# Patient Record
Sex: Female | Born: 1950 | Race: White | Hispanic: No | Marital: Single | Smoking: Never smoker
Health system: Southern US, Community
[De-identification: ages and names within clinical notes are randomized; demographics above are authoritative.]

## PROBLEM LIST (undated history)

## (undated) DIAGNOSIS — H269 Unspecified cataract: Secondary | ICD-10-CM

## (undated) DIAGNOSIS — J189 Pneumonia, unspecified organism: Secondary | ICD-10-CM

## (undated) DIAGNOSIS — E7800 Pure hypercholesterolemia, unspecified: Secondary | ICD-10-CM

## (undated) DIAGNOSIS — E559 Vitamin D deficiency, unspecified: Secondary | ICD-10-CM

## (undated) DIAGNOSIS — K219 Gastro-esophageal reflux disease without esophagitis: Secondary | ICD-10-CM

## (undated) DIAGNOSIS — C801 Malignant (primary) neoplasm, unspecified: Secondary | ICD-10-CM

## (undated) HISTORY — DX: Unspecified cataract: H26.9

## (undated) HISTORY — DX: Vitamin D deficiency, unspecified: E55.9

## (undated) HISTORY — PX: ABDOMINAL HYSTERECTOMY: SHX81

## (undated) HISTORY — DX: Gastro-esophageal reflux disease without esophagitis: K21.9

## (undated) HISTORY — PX: DILATION AND CURETTAGE OF UTERUS: SHX78

## (undated) HISTORY — DX: Malignant (primary) neoplasm, unspecified: C80.1

## (undated) HISTORY — DX: Pure hypercholesterolemia, unspecified: E78.00

---

## 1999-11-06 DIAGNOSIS — G35 Multiple sclerosis: Secondary | ICD-10-CM

## 1999-11-06 DIAGNOSIS — G709 Myoneural disorder, unspecified: Secondary | ICD-10-CM

## 1999-11-06 HISTORY — DX: Multiple sclerosis: G35

## 1999-11-06 HISTORY — DX: Myoneural disorder, unspecified: G70.9

## 2000-03-01 ENCOUNTER — Ambulatory Visit (HOSPITAL_COMMUNITY): Admission: RE | Admit: 2000-03-01 | Discharge: 2000-03-01 | Payer: Self-pay | Admitting: Psychiatry

## 2000-06-03 ENCOUNTER — Encounter: Admission: RE | Admit: 2000-06-03 | Discharge: 2000-06-03 | Payer: Self-pay | Admitting: Family Medicine

## 2000-06-03 ENCOUNTER — Encounter: Payer: Self-pay | Admitting: Family Medicine

## 2000-07-17 ENCOUNTER — Inpatient Hospital Stay (HOSPITAL_COMMUNITY): Admission: RE | Admit: 2000-07-17 | Discharge: 2000-07-19 | Payer: Self-pay | Admitting: Gynecology

## 2013-11-05 DIAGNOSIS — Z8719 Personal history of other diseases of the digestive system: Secondary | ICD-10-CM

## 2013-11-05 HISTORY — PX: BREAST SURGERY: SHX581

## 2013-11-05 HISTORY — DX: Personal history of other diseases of the digestive system: Z87.19

## 2014-02-03 HISTORY — PX: MASTECTOMY: SHX3

## 2014-07-15 ENCOUNTER — Ambulatory Visit: Payer: Self-pay | Admitting: Physical Therapy

## 2014-07-20 ENCOUNTER — Ambulatory Visit: Payer: Self-pay | Admitting: Physical Therapy

## 2014-07-28 ENCOUNTER — Ambulatory Visit: Payer: BC Managed Care – PPO | Attending: Oncology | Admitting: Physical Therapy

## 2014-07-28 DIAGNOSIS — M24519 Contracture, unspecified shoulder: Secondary | ICD-10-CM | POA: Insufficient documentation

## 2014-07-28 DIAGNOSIS — IMO0001 Reserved for inherently not codable concepts without codable children: Secondary | ICD-10-CM | POA: Diagnosis not present

## 2014-07-28 DIAGNOSIS — I890 Lymphedema, not elsewhere classified: Secondary | ICD-10-CM | POA: Diagnosis not present

## 2014-08-02 ENCOUNTER — Ambulatory Visit: Payer: BC Managed Care – PPO | Admitting: Physical Therapy

## 2014-08-02 DIAGNOSIS — IMO0001 Reserved for inherently not codable concepts without codable children: Secondary | ICD-10-CM | POA: Diagnosis not present

## 2014-08-04 ENCOUNTER — Ambulatory Visit: Payer: BC Managed Care – PPO | Admitting: Physical Therapy

## 2014-08-04 DIAGNOSIS — IMO0001 Reserved for inherently not codable concepts without codable children: Secondary | ICD-10-CM | POA: Diagnosis not present

## 2014-08-06 ENCOUNTER — Ambulatory Visit: Payer: BC Managed Care – PPO | Attending: Oncology | Admitting: Physical Therapy

## 2014-08-06 DIAGNOSIS — M24511 Contracture, right shoulder: Secondary | ICD-10-CM | POA: Diagnosis not present

## 2014-08-06 DIAGNOSIS — Z5189 Encounter for other specified aftercare: Secondary | ICD-10-CM | POA: Insufficient documentation

## 2014-08-06 DIAGNOSIS — I890 Lymphedema, not elsewhere classified: Secondary | ICD-10-CM | POA: Insufficient documentation

## 2014-08-09 ENCOUNTER — Ambulatory Visit: Payer: BC Managed Care – PPO | Admitting: Physical Therapy

## 2014-08-09 DIAGNOSIS — Z5189 Encounter for other specified aftercare: Secondary | ICD-10-CM | POA: Diagnosis not present

## 2014-08-11 ENCOUNTER — Ambulatory Visit: Payer: BC Managed Care – PPO | Admitting: Physical Therapy

## 2014-08-11 DIAGNOSIS — Z5189 Encounter for other specified aftercare: Secondary | ICD-10-CM | POA: Diagnosis not present

## 2014-08-13 ENCOUNTER — Ambulatory Visit: Payer: BC Managed Care – PPO | Admitting: Physical Therapy

## 2014-08-13 DIAGNOSIS — Z5189 Encounter for other specified aftercare: Secondary | ICD-10-CM | POA: Diagnosis not present

## 2014-08-16 ENCOUNTER — Ambulatory Visit: Payer: BC Managed Care – PPO | Admitting: Physical Therapy

## 2014-08-16 DIAGNOSIS — Z5189 Encounter for other specified aftercare: Secondary | ICD-10-CM | POA: Diagnosis not present

## 2014-08-18 ENCOUNTER — Ambulatory Visit: Payer: BC Managed Care – PPO | Admitting: Physical Therapy

## 2014-08-18 DIAGNOSIS — Z5189 Encounter for other specified aftercare: Secondary | ICD-10-CM | POA: Diagnosis not present

## 2014-08-20 ENCOUNTER — Ambulatory Visit: Payer: BC Managed Care – PPO | Admitting: Physical Therapy

## 2014-08-20 DIAGNOSIS — Z5189 Encounter for other specified aftercare: Secondary | ICD-10-CM | POA: Diagnosis not present

## 2014-08-23 ENCOUNTER — Ambulatory Visit: Payer: BC Managed Care – PPO | Admitting: Physical Therapy

## 2014-08-23 DIAGNOSIS — Z5189 Encounter for other specified aftercare: Secondary | ICD-10-CM | POA: Diagnosis not present

## 2014-08-25 ENCOUNTER — Ambulatory Visit: Payer: BC Managed Care – PPO | Admitting: Physical Therapy

## 2014-08-25 DIAGNOSIS — Z5189 Encounter for other specified aftercare: Secondary | ICD-10-CM | POA: Diagnosis not present

## 2014-08-27 ENCOUNTER — Ambulatory Visit: Payer: BC Managed Care – PPO | Admitting: Physical Therapy

## 2014-08-27 DIAGNOSIS — Z5189 Encounter for other specified aftercare: Secondary | ICD-10-CM | POA: Diagnosis not present

## 2014-08-30 ENCOUNTER — Ambulatory Visit: Payer: BC Managed Care – PPO | Admitting: Physical Therapy

## 2014-08-30 DIAGNOSIS — Z5189 Encounter for other specified aftercare: Secondary | ICD-10-CM | POA: Diagnosis not present

## 2014-09-01 ENCOUNTER — Ambulatory Visit: Payer: BC Managed Care – PPO

## 2014-09-01 ENCOUNTER — Encounter: Payer: BC Managed Care – PPO | Admitting: Physical Therapy

## 2014-09-01 DIAGNOSIS — Z5189 Encounter for other specified aftercare: Secondary | ICD-10-CM | POA: Diagnosis not present

## 2014-09-02 ENCOUNTER — Telehealth: Payer: Self-pay | Admitting: Physical Therapy

## 2014-09-03 ENCOUNTER — Encounter: Payer: BC Managed Care – PPO | Admitting: Physical Therapy

## 2014-09-03 ENCOUNTER — Ambulatory Visit: Payer: BC Managed Care – PPO | Admitting: Physical Therapy

## 2014-09-03 DIAGNOSIS — Z5189 Encounter for other specified aftercare: Secondary | ICD-10-CM | POA: Diagnosis not present

## 2014-09-06 ENCOUNTER — Encounter: Payer: BC Managed Care – PPO | Admitting: Physical Therapy

## 2014-09-07 ENCOUNTER — Ambulatory Visit: Payer: BC Managed Care – PPO | Attending: Oncology

## 2014-09-07 DIAGNOSIS — Z9013 Acquired absence of bilateral breasts and nipples: Secondary | ICD-10-CM

## 2014-09-07 DIAGNOSIS — M24511 Contracture, right shoulder: Secondary | ICD-10-CM | POA: Insufficient documentation

## 2014-09-07 DIAGNOSIS — Z5189 Encounter for other specified aftercare: Secondary | ICD-10-CM | POA: Diagnosis not present

## 2014-09-07 DIAGNOSIS — Z9889 Other specified postprocedural states: Secondary | ICD-10-CM | POA: Insufficient documentation

## 2014-09-07 DIAGNOSIS — I890 Lymphedema, not elsewhere classified: Secondary | ICD-10-CM | POA: Insufficient documentation

## 2014-09-07 DIAGNOSIS — C50911 Malignant neoplasm of unspecified site of right female breast: Secondary | ICD-10-CM

## 2014-09-07 DIAGNOSIS — L0390 Cellulitis, unspecified: Secondary | ICD-10-CM | POA: Insufficient documentation

## 2014-09-07 NOTE — Therapy (Signed)
Physical Therapy Treatment  Patient Details  Name: Meredith Fernandez MRN: [id number] Date of Birth: 1951/05/20  Encounter Date: 09/07/2014      PT End of Session - 09/07/14 1031    Visit Number 15   Number of Visits 24   PT Start Time 1027   PT Stop Time 2536   PT Time Calculation (min) 49 min      Past Medical History  Diagnosis Date  . Cancer     History reviewed. No pertinent past surgical history.  There were no vitals taken for this visit.  Visit Diagnosis:  Cancer of right breast  History of lymph node dissection of right axilla  H/O bilateral mastectomy      Subjective Assessment - 09/07/14 1029    Symptoms Took bandages off Sunday morning. Nurse gave patient cushioned adhesive to try at thumb web space.    Currently in Pain? No/denies       Manual lymph drainage in supine as follows: short neck, left axillary nodes, right inguinal nodes, superficial and deep abdominals; anterior inter-axillary anastamoses, right axillo-inguinal anastamoses. Right upper extremity from fingers and dorsal hand to lateral shoulder redirecting along pathways. Compression Bandaging to Rt UE: Biotone lotion,cushioned adhesive patient brought at thumb web space, thick stockinette, Elastomull to fingers 1-4 with 1/2" gray foam at dorsal hand and knuckle space, Artiflex x1 with Medi foam/small dots to forearm with 1/2" and 1/4" strips of gray foam over fibrotic tissue of forearm, and 1/4" gray foam to upper arm, 1-6 cm, 2-10 cm, and 1-12 cm short stretch compression bandages from Rt hand to almost axilla (stopped short due to patient receiving radiation).              Plan - 09/07/14 1031    Clinical Impression Statement Rt thumb space seems to be tolerating bandaging well, though does still present with some redness. Patient still presenting with Rt UE lymphedema.   PT Frequency Min 3X/week   PT Duration 8 weeks   PT Plan Continue complete decongestive therapy and remeasure  circumference and AROM next visit.        Problem List Patient Active Problem List   Diagnosis Date Noted  . Cancer of right breast 09/07/2014  . History of lymph node dissection of right axilla 09/07/2014  . Cellulitis left chest 09/07/2014  . H/O bilateral mastectomy 09/07/2014                                        Short Term Clinic Goals - 09/07/14 1034    Title Patient will be able to report overall pain decreased >/= 25% to tolerate daily tasks with less pain.   Time 4   Period Weeks   Status Achieved   Title Patient will be able to reduce edema by 1.5 cm at 10cm proximal to Rt ulnar styloid.   Time 4   Period Weeks   Status Achieved   Title Patient will be able to increase Rt shoulder shoulder active flexion range of motionto >/= 140 degrees for increased functional use of arm.    Time 4   Period Weeks   Status On-going   Title Patient will be able to increase Rt shoulder active abduction range of motion to >/= 120 degrees for increased functional use of arm.    Time 4   Period Weeks   Status Achieved  [facility] Goals - 09/07/14 1038    Title Patient will be able to verbalize good understanding of the Maintenance Phase of treatment including manual lymph drainage, use of compression, and lymphedema risk reduction practices.    Time 8   Period Weeks   Status On-going   Title Patient will be able to reduce edema by 2.5 cm at 10 cm proximal to Rt ulnar styloid.    Time 8   Period Weeks   Status On-going   Title Patient will be able to be independent with a home exercise program.   Time 8   Period Weeks   Status Achieved   Title Patient will be able to report overall pain decreased >/= 50% to tolerate daily tasks with less pain.    Time 8   Period Weeks   Status Achieved   Title Patient will be able to increase Rt shoulder active abduction range of motion to >/= 140 degrees tor increased functional use of arm.     Time 8   Period Weeks   Status On-going   Title Patient will be able to increase Rt shoulder active flexion range of motion to >/= 160 degrees for increased functional use of arm.   Time 8   Period Weeks   Status On-going   Additional Goals Yes         Collie Siad, PTA 09/07/2014 10:53 AM [name] 09/07/2014, 10:53 AM

## 2014-09-08 ENCOUNTER — Encounter: Payer: BC Managed Care – PPO | Admitting: Physical Therapy

## 2014-09-10 ENCOUNTER — Ambulatory Visit: Payer: BC Managed Care – PPO | Admitting: Physical Therapy

## 2014-09-10 DIAGNOSIS — Z9013 Acquired absence of bilateral breasts and nipples: Secondary | ICD-10-CM

## 2014-09-10 DIAGNOSIS — C50911 Malignant neoplasm of unspecified site of right female breast: Secondary | ICD-10-CM

## 2014-09-10 DIAGNOSIS — Z5189 Encounter for other specified aftercare: Secondary | ICD-10-CM | POA: Diagnosis not present

## 2014-09-10 DIAGNOSIS — Z9889 Other specified postprocedural states: Secondary | ICD-10-CM

## 2014-09-10 NOTE — Therapy (Signed)
Physical Therapy Treatment  Patient Details  Name: Meredith Fernandez MRN: [id number] Date of Birth: [birth year]  Encounter Date: 09/10/2014      PT End of Session - 09/10/14 1047    Visit Number 16   Number of Visits 24   PT Start Time 0800   PT Stop Time 0850   PT Time Calculation (min) 50 min      Past Medical History  Diagnosis Date  . Cancer     No past surgical history on file.  There were no vitals taken for this visit.  Visit Diagnosis:  Cancer of right breast  History of lymph node dissection of right axilla  H/O bilateral mastectomy          [facility] Adult PT Treatment/Exercise - 09/10/14 0918    Manual Therapy   Manual Therapy Edema management     Manual lymph drainage in supine as follows: short neck, left axillary nodes, right inguinal nodes, superficial and deep abdominals; anterior inter-axillary anastamoses, right axillo-inguinal anastamoses. Right upper extremity from fingers and dorsal hand to lateral shoulder redirecting along pathways. Patient did range of motion to shoulder, wrist, elbow, fingers and hand. Lotion applied, thick stockinette, foam to dorsal side of hand and at fingers, elastomull to all 5 fingers, orange comprex cut to fit anterior forearm with 1/2 foam to back of forearm, antecubital fossa, 1/4 inch foam at upper arm. 2 artiflex and 4 short stretch bandages       Plan - 09/10/14 1048    Clinical Impression Statement Patient did well with mepilex at thumb space and wants to continue with it. She also did well with extra foam at knuckles. She received a communication that her flexitouch will be delivered today and she should be able to try it out this weekend   PT Frequency Min 3X/week   PT Duration 8 weeks   PT Plan Continue complete decongestive therapy        Problem List Patient Active Problem List   Diagnosis Date Noted  . Cancer of right breast 09/07/2014  . History of lymph node dissection of right axilla 09/07/2014  .  Cellulitis left chest 09/07/2014  . H/O bilateral mastectomy 09/07/2014   Donato Heinz. Owens Shark, PT  09/10/2014, 10:55 AM

## 2014-09-13 ENCOUNTER — Encounter: Payer: BC Managed Care – PPO | Admitting: Physical Therapy

## 2014-09-14 ENCOUNTER — Ambulatory Visit: Payer: BC Managed Care – PPO | Admitting: Physical Therapy

## 2014-09-14 DIAGNOSIS — Z9889 Other specified postprocedural states: Secondary | ICD-10-CM

## 2014-09-14 DIAGNOSIS — C50911 Malignant neoplasm of unspecified site of right female breast: Secondary | ICD-10-CM

## 2014-09-14 DIAGNOSIS — Z9013 Acquired absence of bilateral breasts and nipples: Secondary | ICD-10-CM

## 2014-09-14 DIAGNOSIS — Z5189 Encounter for other specified aftercare: Secondary | ICD-10-CM | POA: Diagnosis not present

## 2014-09-14 NOTE — Therapy (Signed)
Physical Therapy Treatment  Patient Details  Name: Meredith Fernandez MRN: [id number] Date of Birth: 02/16/1951  Encounter Date: 09/14/2014      PT End of Session - 09/14/14 0934    Visit Number 17   Date for PT Re-Evaluation 09/27/14   PT Start Time 0805   PT Stop Time 0900   PT Time Calculation (min) 55 min      Past Medical History  Diagnosis Date  . Cancer     No past surgical history on file.  There were no vitals taken for this visit.  Visit Diagnosis:  Cancer of right breast  History of lymph node dissection of right axilla  H/O bilateral mastectomy      Subjective Assessment - 09/14/14 0924    Symptoms Pt reports she was unable to position for radiation treatment  with bandaging on her arm last week and it all had to be removed.  She will not be able to be bandaged while taking radiation and her last treatment is Dec. 16.  She has been trying to use her Flexitouch at home, but is having trouble applying it correctly.   Currently in Pain? No/denies            [facility] Adult PT Treatment/Exercise - 09/14/14 0927    Neck Exercises: Seated   Cervical Rotation Both;5 reps   Lateral Flexion Both;5 reps   Shoulder Rolls Backwards;5 reps;Forwards   avoid rolling on left due to portacath   Neck Exercises: Supine   Cervical Rotation 5 reps;Both   Lateral Flexion 5 reps;Both   Manual Therapy   Edema Management short neck, superficial and deep abdominals, left axilla , anterior interaxillary anastamoses, right inguinal nodes, right axillo-inguino anastamoses, right shoulder, upper arm, medial elbow, forearm , wirist, hand and fingers with return along lymphatic pathway   Other Manual Therapy neura glide and flossing to RUE          PT Education - 09/14/14 0933    Education provided Yes   Education Details cervical and scapular range of motion   Person(s) Educated Patient   Methods Explanation;Demonstration;Verbal cues   Comprehension Verbalized  understanding;Returned demonstration              Plan - 09/14/14 0935    Clinical Impression Statement lymphedema persists with firmness in anterior forearm, though tissue feels softer  after treatment. Pt was issued another Tg soft with attempt to bring it across upper back and tape it around tank top strap--- sister will help patient with this.  Emailed Rexford Maus and Flexiitouch to contact  patient to follow up   Pt will benefit from skilled therapeutic intervention in order to improve on the following deficits Increased edema;Decreased range of motion   PT Next Visit Plan manual lymph drainage, exercise (issue written home program)   PT Home Exercise Plan cervical range of motion, lymphatic remedial exercise        Problem List Patient Active Problem List   Diagnosis Date Noted  . Cancer of right breast 09/07/2014  . History of lymph node dissection of right axilla 09/07/2014  . Cellulitis left chest 09/07/2014  . H/O bilateral mastectomy 09/07/2014           Short Term Clinic Goals - 09/14/14 1808    CC Short Term Goal  #1   Title Patient will be able to report overall pain decreased >/= 25% to tolerate daily tasks with less pain.   Time 4   Period Weeks  Status Achieved   [facility] Short Term Goal  #2   Title Patient will be able to reduce edema by 1.5 cm at 10cm proximal to Rt ulnar styloid.   Time 4   Period Weeks   Status Achieved   [facility] Short Term Goal  #3   Period Weeks   Status On-going   CC Short Term Goal  #4   Title Patient will be able to increase Rt shoulder active abduction range of motion to >/= 120 degrees for increased functional use of arm.    Time 4   Period Weeks   Status Achieved          Long Term Clinic Goals - 09/14/14 1809    CC Long Term Goal  #1   Title Patient will be able to verbalize good understanding of the Maintenance Phase of treatment including manual lymph drainage, use of compression, and lymphedema risk reduction  practices.    Time 8   Period Weeks   Status On-going   CC Long Term Goal  #2   Title Patient will be able to reduce edema by 2.5 cm at 10 cm proximal to Rt ulnar styloid.    Time 8   Period Weeks   Status On-going   CC Long Term Goal  #3   Title Patient will be able to be independent with a home exercise program.   Time 8   Period Weeks   Status Achieved   CC Long Term Goal  #4   Title Patient will be able to report overall pain decreased >/= 50% to tolerate daily tasks with less pain.    Time 8   Period Weeks   Status Achieved   CC Long Term Goal  #5   Title Patient will be able to increase Rt shoulder active abduction range of motion to >/= 140 degrees tor increased functional use of arm.    Time 8   Period Weeks   Status On-going   CC Long Term Goal  #6   Title Patient will be able to increase Rt shoulder active flexion range of motion to >/= 160 degrees for increased functional use of arm.   Time 8   Period Weeks   Status On-going        Donato Heinz. Brown,PT  09/14/2014, 6:11 PM

## 2014-09-15 ENCOUNTER — Encounter: Payer: BC Managed Care – PPO | Admitting: Physical Therapy

## 2014-09-17 ENCOUNTER — Ambulatory Visit: Payer: BC Managed Care – PPO | Admitting: Physical Therapy

## 2014-09-17 DIAGNOSIS — Z9013 Acquired absence of bilateral breasts and nipples: Secondary | ICD-10-CM

## 2014-09-17 DIAGNOSIS — Z5189 Encounter for other specified aftercare: Secondary | ICD-10-CM | POA: Diagnosis not present

## 2014-09-17 DIAGNOSIS — Z9889 Other specified postprocedural states: Secondary | ICD-10-CM

## 2014-09-17 DIAGNOSIS — C50911 Malignant neoplasm of unspecified site of right female breast: Secondary | ICD-10-CM

## 2014-09-17 NOTE — Patient Instructions (Addendum)
Deep Breathing   Place hands on stomach and take a deep breath, filling diaphragm. Feel hands move out. Exhale fully and feel hands move in. Repeat deep breaths ____ times. Do ____ sessions per day.  http://gt2.exer.us/580   Copyright  VHI. All rights reserved.     DO  NOT DO NECK EXERCISES IF YOU FEEL PULLING AT YOUR PORT SITE  AROM: Neck Rotation   Turn head slowly to look over one shoulder, then the other. Hold each position ____ seconds. Repeat ____ times per set. Do ____ sets per session. Do ____ sessions per day.  http://orth.exer.us/295   Copyright  VHI. All rights reserved.  Neck Side-Bending   Begin with chin level, head centered over spine. Slowly lower one ear toward shoulder. Hold ____ seconds. Slowly return to starting position. Repeat to other side. Repeat ____ times to each side.  Copyright  VHI. All rights reserved.   atlernating backward shoulder rolls   "W" with elbows to back pocket  Arms crossed in front of you "X", open them up and raise  Up to a "Y"  BE GENTLE with you while having radiation! :-)

## 2014-09-17 NOTE — Therapy (Signed)
Physical Therapy Treatment  Patient Details  Name: Meredith Fernandez MRN: [id number] Date of Birth: [birth year]  Encounter Date: 09/17/2014      PT End of Session - 09/17/14 0902    Visit Number 18   Number of Visits 24   Date for PT Re-Evaluation 09/27/14   PT Start Time 0800   PT Stop Time 0848   PT Time Calculation (min) 48 min      Past Medical History  Diagnosis Date  . Cancer     No past surgical history on file.  There were no vitals taken for this visit.  Visit Diagnosis:  Cancer of right breast  History of lymph node dissection of right axilla  H/O bilateral mastectomy      Subjective Assessment - 09/17/14 0811    Symptoms had flexitouch set up and is now is able to use it on her own and plans to use it daily. garments are still in insurance preauthorization.  She feels well this morning   Currently in Pain? No/denies          [facility] PT Assessment - 09/17/14 0001    AROM   Right Shoulder Extension 68 Degrees   Right Shoulder Flexion 146 Degrees   Right Shoulder ABduction 148 Degrees   Right Shoulder Internal Rotation 62 Degrees   Right Shoulder External Rotation 76 Degrees          [facility] Adult PT Treatment/Exercise - 09/17/14 0816    Neck Exercises: Seated   Cervical Rotation Both;5 reps   Lateral Flexion Both;5 reps   Shoulder Rolls Backwards;5 reps;Forwards   avoid rolling on left due to portacath   Neck Exercises: Supine   Cervical Rotation --   Lateral Flexion --   Other Supine Exercise  continue with hand, wrist and hand exericses                Plan - 09/17/14 0902    Clinical Impression Statement Improvement on circumference measurements today and shoulder ROM is met. Pt to call MD about removal of portacath. She will continue with flexitouch, self manual lymph drainage, compression glove and tg soft at home until garments arrive. Doing well with addition of more exercise    PT Next Visit Plan Review and adjust exercise program,  continue with manual lymph drainage        Problem List Patient Active Problem List   Diagnosis Date Noted  . Cancer of right breast 09/07/2014  . History of lymph node dissection of right axilla 09/07/2014  . Cellulitis left chest 09/07/2014  . H/O bilateral mastectomy 09/07/2014            LYMPHEDEMA/ONCOLOGY QUESTIONNAIRE - 09/17/14 0826    Right Upper Extremity Lymphedema   10 cm Proximal to Olecranon Process 35.7 cm   Olecranon Process 28 cm   15 cm Proximal to Ulnar Styloid Process 29.8 cm   10 cm Proximal to Ulnar Styloid Process 28 cm   Just Proximal to Ulnar Styloid Process 19.4 cm   Across Hand at [employer] 21.6 cm   At [place] of 2nd Digit 7.3 cm   Other 29.5           Short Term Clinic Goals - 09/17/14 0905    CC Short Term Goal  #1   Title Patient will be able to report overall pain decreased >/= 25% to tolerate daily tasks with less pain.   Status New   CC Short Term Goal  #2  Title Patient will be able to reduce edema by 1.5 cm at 10cm proximal to Rt ulnar styloid.   Status Achieved   CC Short Term Goal  #3   Title Patient will be able to increase Rt shoulder shoulder active flexion range of motionto >/= 140 degrees for increased functional use of arm.    Status Achieved   CC Short Term Goal  #4   Title Patient will be able to increase Rt shoulder active abduction range of motion to >/= 120 degrees for increased functional use of arm.    Status Achieved          Long Term Clinic Goals - 09/17/14 0908    CC Long Term Goal  #1   Title Patient will be able to verbalize good understanding of the Maintenance Phase of treatment including manual lymph drainage, use of compression, and lymphedema risk reduction practices.    Status On-going   CC Long Term Goal  #2   Title Patient will be able to reduce edema by 2.5 cm at 10 cm proximal to Rt ulnar styloid.    Status On-going   CC Long Term Goal  #3   Title Patient will be able to be  independent with a home exercise program.   Status On-going   CC Long Term Goal  #4   Title Patient will be able to report overall pain decreased >/= 50% to tolerate daily tasks with less pain.    Status Achieved   CC Long Term Goal  #5   Title Patient will be able to increase Rt shoulder active abduction range of motion to >/= 140 degrees tor increased functional use of arm.    Status Achieved   CC Long Term Goal  #6   Title Patient will be able to increase Rt shoulder active flexion range of motion to >/= 160 degrees for increased functional use of arm.   Status On-going        Donato Heinz. Owens Shark, PT  09/17/2014, 9:10 AM

## 2014-09-20 ENCOUNTER — Ambulatory Visit: Payer: BC Managed Care – PPO | Admitting: Physical Therapy

## 2014-09-20 DIAGNOSIS — Z5189 Encounter for other specified aftercare: Secondary | ICD-10-CM | POA: Diagnosis not present

## 2014-09-20 DIAGNOSIS — Z9013 Acquired absence of bilateral breasts and nipples: Secondary | ICD-10-CM

## 2014-09-20 NOTE — Therapy (Signed)
Physical Therapy Treatment  Patient Details  Name: SADIYA DURAND MRN: [id number] Date of Birth: 01-25-1951  Encounter Date: 09/20/2014      PT End of Session - 09/20/14 1734    Visit Number 19   Number of Visits 24   Date for PT Re-Evaluation 09/27/14   PT Start Time 1520   PT Stop Time 1607   PT Time Calculation (min) 47 min      Past Medical History  Diagnosis Date  . Cancer     No past surgical history on file.  There were no vitals taken for this visit.  Visit Diagnosis:  H/O bilateral mastectomy      Subjective Assessment - 09/20/14 1525    Symptoms Worried that arms swells right back up after taking Reidsleeve off.  Has lost 12 lbs. since last Monday to today.   Currently in Pain? No/denies            [facility] Adult PT Treatment/Exercise - 09/20/14 0001    Exercises   Exercises Shoulder   Neck Exercises: Seated   Cervical Rotation Both;10 reps   Lateral Flexion Both;5 reps   Lateral Flexion Limitations --  left neck feels tight with left rotation and sidebend   Shoulder Exercises: Seated   Retraction AROM;Both;10 reps   Other Seated Exercises backward shoulder rolls x 10   Manual Therapy   Manual Therapy Edema management   Manual Therapy   Edema Management Manual lymph drainage:  short neck, superficial and deep abdomen, left axilla and anterior interaxillary anastomosis, right groin and axillo-inguinal anastomosis, and right UE from fingers to shoulder.  Gave patient new TG soft to wear.                Plan - 09/20/14 1735    Clinical Impression Statement Circumference measurements were increased today at her wrist; more proximal measurements were a bit smaller.   Pt will benefit from skilled therapeutic intervention in order to improve on the following deficits Increased edema;Decreased strength   Rehab Potential Excellent   PT Next Visit Plan G-code next.  Continue exercise and manual lymph drainage.        Problem List Patient  Active Problem List   Diagnosis Date Noted  . Cancer of right breast 09/07/2014  . History of lymph node dissection of right axilla 09/07/2014  . Cellulitis left chest 09/07/2014  . H/O bilateral mastectomy 09/07/2014            LYMPHEDEMA/ONCOLOGY QUESTIONNAIRE - 09/20/14 1528    Right Upper Extremity Lymphedema   10 cm Proximal to Olecranon Process 34.4 cm   Olecranon Process 28 cm   15 cm Proximal to Ulnar Styloid Process 29.1 cm   10 cm Proximal to Ulnar Styloid Process 26.5 cm   Just Proximal to Ulnar Styloid Process 21.2 cm   Across Hand at [employer] 21.7 cm   At [place] of 2nd Digit 7.2 cm                                        Long Term Clinic Goals - 09/20/14 1737    CC Long Term Goal  #1   Status Achieved          [place],DONNA, PT 09/20/2014, 5:38 PM

## 2014-09-22 ENCOUNTER — Ambulatory Visit: Payer: BC Managed Care – PPO | Admitting: Physical Therapy

## 2014-09-22 ENCOUNTER — Encounter: Payer: BC Managed Care – PPO | Admitting: Physical Therapy

## 2014-09-22 DIAGNOSIS — Z5189 Encounter for other specified aftercare: Secondary | ICD-10-CM | POA: Diagnosis not present

## 2014-09-22 DIAGNOSIS — C50911 Malignant neoplasm of unspecified site of right female breast: Secondary | ICD-10-CM

## 2014-09-22 DIAGNOSIS — Z9013 Acquired absence of bilateral breasts and nipples: Secondary | ICD-10-CM

## 2014-09-22 DIAGNOSIS — Z9889 Other specified postprocedural states: Secondary | ICD-10-CM

## 2014-09-22 NOTE — Therapy (Signed)
Physical Therapy Treatment  Patient Details  Name: Meredith Fernandez MRN: [id number] Date of Birth: [birth year]  Encounter Date: 09/22/2014      PT End of Session - 09/22/14 0957    Visit Number 20   Number of Visits 24   Date for PT Re-Evaluation 09/27/14   PT Start Time 0850   PT Stop Time 0935   PT Time Calculation (min) 45 min      Past Medical History  Diagnosis Date  . Cancer     No past surgical history on file.  There were no vitals taken for this visit.  Visit Diagnosis:  H/O bilateral mastectomy  Cancer of right breast  History of lymph node dissection of right axilla      Subjective Assessment - 09/22/14 0951    Symptoms feeling tired today.  has developed an itchy red rash on chest that she is treating with over the counter hydrocortisone   Currently in Pain? No/denies            [facility] Adult PT Treatment/Exercise - 09/22/14 0954    Exercises   Exercises Shoulder   Neck Exercises: Seated   Cervical Rotation Both;5 reps   Lateral Flexion Both;5 reps   Lateral Flexion Limitations --  left neck feels tight with left rotation and sidebend   Shoulder Rolls Backwards;5 reps   Shoulder Exercises: Seated   Retraction AROM;Both;10 reps   Other Seated Exercises backward shoulder rolls x 10   Other Seated Exercises diphagmatic breathing   Manual Therapy   Manual Therapy Edema management       Manual lymph drainage in supine as follows: short neck, right inguinal nodes, superficial and deep abdominals, right axillo-inguinal anastamoses. Right upper extremity from fingers and dorsal hand to lateral shoulder redirecting along pathways. Avoided skin that has is red from radiation.       Plan - 09/22/14 0957    Clinical Impression Statement Arm feels soft overall today, but still with area of some firmer tissue at anterior forearm.    PT Next Visit Plan Continue exercise and manual lymph drainage.        Problem List Patient Active Problem List   Diagnosis Date Noted  . Cancer of right breast 09/07/2014  . History of lymph node dissection of right axilla 09/07/2014  . Cellulitis left chest 09/07/2014  . H/O bilateral mastectomy 09/07/2014     Donato Heinz. Brown,PT  09/22/2014, 10:01 AM

## 2014-09-24 ENCOUNTER — Ambulatory Visit: Payer: BC Managed Care – PPO | Admitting: Physical Therapy

## 2014-09-24 DIAGNOSIS — Z5189 Encounter for other specified aftercare: Secondary | ICD-10-CM | POA: Diagnosis not present

## 2014-09-24 DIAGNOSIS — Z9013 Acquired absence of bilateral breasts and nipples: Secondary | ICD-10-CM

## 2014-09-24 DIAGNOSIS — Z9889 Other specified postprocedural states: Secondary | ICD-10-CM

## 2014-09-24 DIAGNOSIS — C50911 Malignant neoplasm of unspecified site of right female breast: Secondary | ICD-10-CM

## 2014-09-24 NOTE — Therapy (Signed)
Physical Therapy Treatment  Patient Details  Name: Meredith Fernandez MRN: [id number] Date of Birth: August 21, 1951  Encounter Date: 09/24/2014      PT End of Session - 09/24/14 0954    Visit Number 21   Number of Visits 24   Date for PT Re-Evaluation 09/27/14   PT Start Time 0846   PT Stop Time 0940   PT Time Calculation (min) 54 min   Activity Tolerance Patient tolerated treatment well      Past Medical History  Diagnosis Date  . Cancer     No past surgical history on file.  There were no vitals taken for this visit.  Visit Diagnosis:  H/O bilateral mastectomy  Cancer of right breast  History of lymph node dissection of right axilla      Subjective Assessment - 09/24/14 0902    Symptoms feeling ok today, better energy   Currently in Pain? No/denies          [facility] PT Assessment - 09/24/14 0949    AROM   Right Shoulder Flexion 147 Degrees   Right Shoulder ABduction 148 Degrees          [facility] Adult PT Treatment/Exercise - 09/24/14 0001    Neck Exercises: Stretches   Lower Cervical/Upper Thoracic Stretch 3 reps   Neck Exercises: Seated   Cervical Rotation Both;5 reps   Lateral Flexion Both;5 reps   Lateral Flexion Limitations --  left neck feels tight with left rotation and sidebend   Shoulder Rolls Backwards;5 reps   Shoulder Exercises: Seated   Retraction AROM;Both;10 reps   Other Seated Exercises diphagmatic breathing     Manual lymph drainage in supine as follows: short neck, right inguinal nodes, superficial and deep abdominals; right axillo-inguinal anastamoses. Right upper extremity from fingers and dorsal hand to lateral shoulder redirecting along pathways. Avoided anterior chest and right axilla due to sking irritation from radiation.  Extra time spent on right anterior forearm and range of motion at wrist and forearm done concurrently at time to enhance manual lymph drainage        PT Education - 09/24/14 0953    Education provided Yes   Education Details lymphatc remedial exercise   Person(s) Educated Patient   Methods Explanation;Demonstration;Verbal cues;Handout   Comprehension Verbalized understanding;Returned demonstration              Plan - 09/24/14 0955    Clinical Impression Statement Arm continues to feel softer at anterior forearm.  She has increase redness at axilla. Pt encouraged to practice self manual lymph drainage with DVD this weekend and add more range of motion exercise throughout the day   PT Next Visit Plan Continue exercise and manual lymph drainage.        Problem List Patient Active Problem List   Diagnosis Date Noted  . Cancer of right breast 09/07/2014  . History of lymph node dissection of right axilla 09/07/2014  . Cellulitis left chest 09/07/2014  . H/O bilateral mastectomy 09/07/2014        Short Term Clinic Goals - 09/24/14 [phone]    CC Short Term Goal  #1   Title Patient will be able to report overall pain decreased >/= 25% to tolerate daily tasks with less pain.   Time 4   Period Weeks   Status Achieved   CC Short Term Goal  #2   Title Patient will be able to reduce edema by 1.5 cm at 10cm proximal to Rt ulnar styloid.   Time  4   Status Achieved   CC Short Term Goal  #3   Title Patient will be able to increase Rt shoulder shoulder active flexion range of motionto >/= 140 degrees for increased functional use of arm.    Time 4   Period Weeks   Status Achieved   CC Short Term Goal  #4   Time 4   Period Weeks   Status Achieved          Long Term Clinic Goals - 09/24/14 3762    CC Long Term Goal  #1   Title Patient will be able to verbalize good understanding of the Maintenance Phase of treatment including manual lymph drainage, use of compression, and lymphedema risk reduction practices.    Period Weeks   Status Achieved   CC Long Term Goal  #2   Title Patient will be able to reduce edema by 2.5 cm at 10 cm proximal to Rt ulnar styloid.    Time 8   Period  Weeks   Status On-going   CC Long Term Goal  #3   Title Patient will be able to be independent with a home exercise program.   Time 8   Period Weeks   Status On-going   CC Long Term Goal  #4   Title Patient will be able to report overall pain decreased >/= 50% to tolerate daily tasks with less pain.    Time 8   Period Weeks   Status Achieved   CC Long Term Goal  #5   Title Patient will be able to increase Rt shoulder active abduction range of motion to >/= 140 degrees tor increased functional use of arm.    Time 8   Period Weeks   Status New   CC Long Term Goal  #6   Title Patient will be able to increase Rt shoulder active flexion range of motion to >/= 160 degrees for increased functional use of arm.   Time 8   Period Weeks   Status On-going  axilla currently red an irritated from [place]. Brown, PT 09/24/2014, 10:00 AM

## 2014-09-24 NOTE — Patient Instructions (Signed)
Diaphragmatic - Sitting or Standing   Inhale through nose making navel move out toward hands. Exhale through puckered lips, hands follow navel in. Repeat __3_ times. Rest __10_ seconds between repeats. Do _3__ times per day.  Copyright  VHI. All rights reserved.  Elevation / Depression   While slowly inhaling, bring shoulders toward ears. Hold ___ seconds. Slowly exhale while relaxing shoulders backward and downward. Repeat _5__ times. Do _5__ times per day.  Copyright  VHI. All rights reserved.  Chicken Wings   With thumbs in armpits and elbows at sides, lift elbows toward ears then lower to sides. Repeat _5_ times. Do _2_ times per day.  Copyright  VHI. All rights reserved.  Arm Cross Above Head   With arms straight out from sides of trunk, cross over above head. Repeat _5__ times. Do __2_ times per day.  Copyright  VHI. All rights reserved.  Arm Cross Above Head   With arms straight out from sides of trunk, cross over above head. Repeat ___ times. Do ___ times per day.  Copyright  VHI. All rights reserved.

## 2014-09-27 ENCOUNTER — Ambulatory Visit: Payer: BC Managed Care – PPO | Admitting: Physical Therapy

## 2014-09-27 DIAGNOSIS — Z9889 Other specified postprocedural states: Secondary | ICD-10-CM

## 2014-09-27 DIAGNOSIS — C50911 Malignant neoplasm of unspecified site of right female breast: Secondary | ICD-10-CM

## 2014-09-27 DIAGNOSIS — Z9013 Acquired absence of bilateral breasts and nipples: Secondary | ICD-10-CM

## 2014-09-27 DIAGNOSIS — Z5189 Encounter for other specified aftercare: Secondary | ICD-10-CM | POA: Diagnosis not present

## 2014-09-27 NOTE — Therapy (Signed)
Physical Therapy Treatment  Patient Details  Name: JAVAE BRAATEN MRN: [id number] Date of Birth: 1951-07-27  Encounter Date: 09/27/2014      PT End of Session - 09/27/14 1214    Visit Number 32  next wll be 1   Number of Visits 24  next will be 8   Date for PT Re-Evaluation 09/27/14  next will be 10/25/2014   PT Start Time 0802   PT Stop Time 0848   PT Time Calculation (min) 46 min   Behavior During Therapy [facility] for tasks assessed/performed      Past Medical History  Diagnosis Date  . Cancer     No past surgical history on file.  There were no vitals taken for this visit.  Visit Diagnosis:  H/O bilateral mastectomy  Cancer of right breast  History of lymph node dissection of right axilla      Subjective Assessment - 09/27/14 0808    Symptoms " I feel good" some itching at chest, but otherwise OK    Currently in Pain? No/denies            [facility] Adult PT Treatment/Exercise - 09/27/14 1212    Neck Exercises: Stretches   Lower Cervical/Upper Thoracic Stretch 3 reps   Neck Exercises: Seated   Cervical Rotation Both;5 reps   Lateral Flexion Both;5 reps   Lateral Flexion Limitations --  left neck feels tight with left rotation and sidebend   W Back 5 reps   Shoulder Rolls Backwards;5 reps   Shoulder Exercises: Seated   Retraction AROM;Both;10 reps   Other Seated Exercises diphagmatic breathing       Manual lymph drainage in supine as follows: short neck, left axillary nodes, right inguinal nodes, superficial and deep abdominals; anterior inter-axillary anastamoses, right axillo-inguinal anastamoses. Right upper extremity from fingers and dorsal hand to lateral shoulder redirecting along pathways.         Plan - 09/27/14 1216    Clinical Impression Statement Remeasure arm and she has reduction in hand and wrist are.  Pt reports her scales at home show a 14 pound weight loss since last week.   She will check that out at MD office.  She feels well and  feels she has more energy. Recertification sent for more visits so she can keep coming until garments arrive. She is able to do her exercise with less verbal cues   PT Next Visit Plan Continue exercise and manual lymph drainage.        Problem List Patient Active Problem List   Diagnosis Date Noted  . Cancer of right breast 09/07/2014  . History of lymph node dissection of right axilla 09/07/2014  . Cellulitis left chest 09/07/2014  . H/O bilateral mastectomy 09/07/2014            LYMPHEDEMA/ONCOLOGY QUESTIONNAIRE - 09/27/14 0812    Right Upper Extremity Lymphedema   Olecranon Process 28 cm   15 cm Proximal to Ulnar Styloid Process 29.4 cm   10 cm Proximal to Ulnar Styloid Process 26.5 cm   Just Proximal to Ulnar Styloid Process 19.2 cm   Across Hand at [employer] 21.5 cm   At [place] of 2nd Digit 7 cm           Short Term Clinic Goals - 09/27/14 1219    CC Short Term Goal  #1   Title Patient will be able to report overall pain decreased >/= 25% to tolerate daily tasks with less pain.  Status Achieved   CC Short Term Goal  #2   Title Patient will be able to reduce edema by 1.5 cm at 10cm proximal to Rt ulnar styloid.   Status Achieved   CC Short Term Goal  #3   Title Patient will be able to increase Rt shoulder shoulder active flexion range of motionto >/= 140 degrees for increased functional use of arm.    Status Achieved          Long Term Clinic Goals - 09/27/14 1219    CC Long Term Goal  #1   Title Patient will be able to verbalize good understanding of the Maintenance Phase of treatment including manual lymph drainage, use of compression, and lymphedema risk reduction practices.    Status Achieved   CC Long Term Goal  #2   Title Patient will be able to reduce edema by 2.5 cm at 10 cm proximal to Rt ulnar styloid.    Time 4   Period Weeks   Status On-going   CC Long Term Goal  #3   Title Patient will be able to be independent with a home exercise  program.   Time 4   Period Weeks   Status On-going   CC Long Term Goal  #4   Title Patient will be able to report overall pain decreased >/= 50% to tolerate daily tasks with less pain.    Status Achieved   CC Long Term Goal  #5   Title Patient will be able to increase Rt shoulder active abduction range of motion to >/= 140 degrees tor increased functional use of arm.    Time 4   Period Weeks   Status On-going   CC Long Term Goal  #6   Title Patient will be able to increase Rt shoulder active flexion range of motion to >/= 160 degrees for increased functional use of arm.   Time 4   Period Weeks   Status On-going         Donato Heinz. Owens Shark, PT [place] 09/27/2014, 12:21 PM

## 2014-09-29 ENCOUNTER — Ambulatory Visit: Payer: BC Managed Care – PPO | Admitting: Physical Therapy

## 2014-09-29 DIAGNOSIS — Z5189 Encounter for other specified aftercare: Secondary | ICD-10-CM | POA: Diagnosis not present

## 2014-09-29 DIAGNOSIS — C50911 Malignant neoplasm of unspecified site of right female breast: Secondary | ICD-10-CM

## 2014-09-29 DIAGNOSIS — Z9013 Acquired absence of bilateral breasts and nipples: Secondary | ICD-10-CM

## 2014-09-29 DIAGNOSIS — Z9889 Other specified postprocedural states: Secondary | ICD-10-CM

## 2014-09-29 NOTE — Therapy (Signed)
Physical Therapy Treatment  Patient Details  Name: Meredith Fernandez MRN: [id number] Date of Birth: [birth year]  Encounter Date: 09/29/2014      PT End of Session - 09/29/14 1242    Visit Number 23   Number of Visits 32   Date for PT Re-Evaluation 10/25/14   PT Start Time 4585   PT Stop Time 1100   PT Time Calculation (min) 45 min      Past Medical History  Diagnosis Date  . Cancer     No past surgical history on file.  There were no vitals taken for this visit.  Visit Diagnosis:  H/O bilateral mastectomy  Cancer of right breast  History of lymph node dissection of right axilla      Subjective Assessment - 09/29/14 1020    Symptoms pt reports she is a little tired and has been at work already this morning      Treatment: Manual lymph drainage in supine as follows: short neck, , right inguinal nodes, superficial and deep abdominals; , right axillo-inguinal anastamoses. Right upper extremity from fingers and dorsal hand to lateral shoulder redirecting along pathways. Avoided all areas affected by radiation      Plan - 09/29/14 1251    Clinical Impression Statement --  pt doing her neck and scapular range of motion on her own        Problem List Patient Active Problem List   Diagnosis Date Noted  . Cancer of right breast 09/07/2014  . History of lymph node dissection of right axilla 09/07/2014  . Cellulitis left chest 09/07/2014  . H/O bilateral mastectomy 09/07/2014       Donato Heinz. Owens Shark, PT 09/29/2014, 12:53 PM

## 2014-10-07 ENCOUNTER — Ambulatory Visit: Payer: BC Managed Care – PPO | Attending: Oncology | Admitting: Physical Therapy

## 2014-10-07 DIAGNOSIS — Z5189 Encounter for other specified aftercare: Secondary | ICD-10-CM | POA: Insufficient documentation

## 2014-10-07 DIAGNOSIS — I890 Lymphedema, not elsewhere classified: Secondary | ICD-10-CM | POA: Diagnosis not present

## 2014-10-07 DIAGNOSIS — M24511 Contracture, right shoulder: Secondary | ICD-10-CM | POA: Insufficient documentation

## 2014-10-07 DIAGNOSIS — Z9889 Other specified postprocedural states: Secondary | ICD-10-CM

## 2014-10-07 DIAGNOSIS — C50911 Malignant neoplasm of unspecified site of right female breast: Secondary | ICD-10-CM

## 2014-10-07 DIAGNOSIS — Z9013 Acquired absence of bilateral breasts and nipples: Secondary | ICD-10-CM

## 2014-10-07 NOTE — Therapy (Signed)
[place], [place], [address] Phone: [phone]   Fax:  [phone]  Physical Therapy Treatment  Patient Details  Name: Meredith Fernandez MRN: [id number] Date of Birth: 11-02-1951  Encounter Date: 10/07/2014      PT End of Session - 10/07/14 0958    Visit Number 24   Number of Visits 32   Date for PT Re-Evaluation 10/25/14   PT Start Time 0845   PT Stop Time 0930   PT Time Calculation (min) 45 min   Activity Tolerance Patient tolerated treatment well   Behavior During Therapy [facility] for tasks assessed/performed      Past Medical History  Diagnosis Date  . Cancer     No past surgical history on file.  There were no vitals taken for this visit.  Visit Diagnosis:  H/O bilateral mastectomy  Cancer of right breast  History of lymph node dissection of right axilla      Subjective Assessment - 10/07/14 0952    Symptoms Pt states she had a rough weekend.  She has had skin breakdown at right chest in raditaion field that has been draining on her clothing. She reports that she has lost 15 pounds in the past 3 weeks unintentionally   Currently in Pain? No/denies  she does heve discomfort when radiation field is stretched      Treatment Manual lymph drainage in supine as follows: short neck, left axillary nodes, right inguinal nodes, superficial and deep abdominals; anterior inter-axillary anastamoses, right axillo-inguinal anastamoses. Right upper extremity from fingers and dorsal hand to lateral shoulder redirecting along pathways. Avoided radiation fields.  Pt rolled to left sidelying and worked on lateral chest and back to congested areas         Plan - 10/07/14 0958    Clinical Impression Statement Edema continues on eight arm, but does not feel as firm as it had been.  She continues to have most congestion in hand and palmar side of forearm.     PT Next Visit Plan Continue exercise and manual lymph  drainage.until garments arrive.         Long Term Clinic Goals - 10/07/14 1000    CC Long Term Goal  #1   Title Patient will be able to verbalize good understanding of the Maintenance Phase of treatment including manual lymph drainage, use of compression, and lymphedema risk reduction practices.    Time 8   Period Weeks   Status Achieved   CC Long Term Goal  #2   Title Patient will be able to reduce edema by 2.5 cm at 10 cm proximal to Rt ulnar styloid.    Time 4   Period Weeks   Status On-going   CC Long Term Goal  #3   Title Patient will be able to be independent with a home exercise program.   Time 4   Period Weeks   Status On-going   CC Long Term Goal  #4   Title Patient will be able to report overall pain decreased >/= 50% to tolerate daily tasks with less pain.    Time 8   Status Achieved   CC Long Term Goal  #5   Title Patient will be able to increase Rt shoulder active abduction range of motion to >/= 140 degrees tor increased functional use of arm.    Time 4   Period Weeks   Status On-going   CC Long Term Goal  #6   Title Patient  will be able to increase Rt shoulder active flexion range of motion to >/= 160 degrees for increased functional use of arm.   Time 4   Period Weeks   Status On-going         Problem List Patient Active Problem List   Diagnosis Date Noted  . Cancer of right breast 09/07/2014  . History of lymph node dissection of right axilla 09/07/2014  . Cellulitis left chest 09/07/2014  . H/O bilateral mastectomy 09/07/2014   Donato Heinz. Owens Shark, PT   10/07/2014, 10:02 AM

## 2014-10-11 ENCOUNTER — Ambulatory Visit: Payer: BC Managed Care – PPO | Admitting: Physical Therapy

## 2014-10-14 ENCOUNTER — Encounter: Payer: Self-pay | Admitting: Physical Therapy

## 2014-10-14 ENCOUNTER — Ambulatory Visit: Payer: BC Managed Care – PPO | Admitting: Physical Therapy

## 2014-10-14 DIAGNOSIS — Z9889 Other specified postprocedural states: Secondary | ICD-10-CM

## 2014-10-14 DIAGNOSIS — Z9013 Acquired absence of bilateral breasts and nipples: Secondary | ICD-10-CM

## 2014-10-14 DIAGNOSIS — Z5189 Encounter for other specified aftercare: Secondary | ICD-10-CM | POA: Diagnosis not present

## 2014-10-14 DIAGNOSIS — C50911 Malignant neoplasm of unspecified site of right female breast: Secondary | ICD-10-CM

## 2014-10-15 NOTE — Therapy (Signed)
[place], [place], [address] Phone: [phone]   Fax:  [phone]  Physical Therapy Treatment  Patient Details  Name: Meredith Fernandez MRN: [id number] Date of Birth: 1951/10/23  Encounter Date: 10/14/2014      PT End of Session - 10/15/14 1358    Visit Number 25   Number of Visits 32   Date for PT Re-Evaluation 10/25/14   PT Start Time 0800   PT Stop Time 0845   PT Time Calculation (min) 45 min      Past Medical History  Diagnosis Date  . Cancer     History reviewed. No pertinent past surgical history.  There were no vitals taken for this visit.  Visit Diagnosis:  H/O bilateral mastectomy  Cancer of right breast  History of lymph node dissection of right axilla      Subjective Assessment - 10/14/14 0806    Symptoms pt reports that her radiation has been suspended because her skin is burned , open and draining from treatment.  She received her sleeve and glove on Monday.  The sleeve is feels good and stays up, but her glove does not fit well and is not doing any good   Currently in Pain? Yes   Pain Score 1    Pain Location Axilla      Treatment:  Manual lymph drainage in supine as follows: short neck, left axillary nodes, right inguinal nodes, superficial and deep abdominals; anterior inter-axillary anastamoses, right axillo-inguinal anastamoses. Right upper extremity from fingers and dorsal hand to lateral shoulder redirecting along pathways.         Plan - 10/15/14 1358    Clinical Impression Statement Patient is concerned that compression glove is not working well and she does have modest increase in measurements in hand and forearm. She will continue to work with it. She is struggling with pain and inflammation from radiation burns. Contacted Rexford Maus to let her know pt has concerns about her glove.   PT Next Visit Plan Reassess compression garments and circumferences. Discuss  plans ?? continue until patient is healed enough to use her flexitouch and nightime garment              LYMPHEDEMA/ONCOLOGY QUESTIONNAIRE - 10/14/14 0810    Right Upper Extremity Lymphedema   Olecranon Process (p) 26.9 cm   15 cm Proximal to Ulnar Styloid Process (p) 29 cm   10 cm Proximal to Ulnar Styloid Process (p) 27 cm   Just Proximal to Ulnar Styloid Process (p) 19 cm   Across Hand at [employer] (p) 21.7 cm   At [facility] of 2nd Digit (p) 7.2 cm           Short Term Clinic Goals - 10/15/14 1412    CC Short Term Goal  #1   Title Patient will be able to report overall pain decreased >/= 25% to tolerate daily tasks with less pain.   Time 4   Status Achieved   CC Short Term Goal  #2   Title Patient will be able to reduce edema by 1.5 cm at 10cm proximal to Rt ulnar styloid.   Time 4   Period Weeks   Status Achieved   CC Short Term Goal  #3   Title Patient will be able to increase Rt shoulder shoulder active flexion range of motionto >/= 140 degrees for increased functional use of arm.    Time 4   Period Weeks   Status  Achieved   CC Short Term Goal  #4   Title Patient will be able to increase Rt shoulder active abduction range of motion to >/= 120 degrees for increased functional use of arm.    Time 4   Period Weeks   Status Achieved         Long Term Clinic Goals - 10/15/14 1413    CC Long Term Goal  #1   Title Patient will be able to verbalize good understanding of the Maintenance Phase of treatment including manual lymph drainage, use of compression, and lymphedema risk reduction practices.    Time 8   Period Weeks   Status Achieved   CC Long Term Goal  #2   Title Patient will be able to reduce edema by 2.5 cm at 10 cm proximal to Rt ulnar styloid.    Period Weeks   Status On-going   CC Long Term Goal  #3   Title Patient will be able to be independent with a home exercise program.   Period Weeks   Status On-going   CC Long Term Goal  #4   Title  Patient will be able to report overall pain decreased >/= 50% to tolerate daily tasks with less pain.    Time 8   Period Weeks   Status Achieved  new onset of pain due to radiation burns   CC Long Term Goal  #5   Title Patient will be able to increase Rt shoulder active abduction range of motion to >/= 140 degrees tor increased functional use of arm.    Time 4   Period Weeks   Status On-going   CC Long Term Goal  #6   Title Patient will be able to increase Rt shoulder active flexion range of motion to >/= 160 degrees for increased functional use of arm.   Time 4   Period Weeks   Status On-going         Problem List Patient Active Problem List   Diagnosis Date Noted  . Cancer of right breast 09/07/2014  . History of lymph node dissection of right axilla 09/07/2014  . Cellulitis left chest 09/07/2014  . H/O bilateral mastectomy 09/07/2014   Donato Heinz. Owens Shark, PT 10/15/2014, 2:14 PM

## 2014-10-18 ENCOUNTER — Ambulatory Visit: Payer: BC Managed Care – PPO

## 2014-10-18 DIAGNOSIS — Z5189 Encounter for other specified aftercare: Secondary | ICD-10-CM | POA: Diagnosis not present

## 2014-10-18 DIAGNOSIS — Z9889 Other specified postprocedural states: Secondary | ICD-10-CM

## 2014-10-18 DIAGNOSIS — Z9013 Acquired absence of bilateral breasts and nipples: Secondary | ICD-10-CM

## 2014-10-18 DIAGNOSIS — C50911 Malignant neoplasm of unspecified site of right female breast: Secondary | ICD-10-CM

## 2014-10-18 NOTE — Therapy (Signed)
[place], [place], [address] Phone: [phone]   Fax:  [phone]  Physical Therapy Treatment  Patient Details  Name: Meredith Fernandez MRN: [id number] Date of Birth: Sep 07, 1951  Encounter Date: 10/18/2014      PT End of Session - 10/18/14 0807    Visit Number 26   Number of Visits 32   Date for PT Re-Evaluation 10/25/14   PT Start Time 0800   PT Stop Time 0848   PT Time Calculation (min) 48 min      Past Medical History  Diagnosis Date  . Cancer     No past surgical history on file.  There were no vitals taken for this visit.  Visit Diagnosis:  H/O bilateral mastectomy  Cancer of right breast  History of lymph node dissection of right axilla      Subjective Assessment - 10/18/14 0803    Symptoms Still dont think the glove isnt working, the [employer] added last visit into my glove didnt even seem to make a difference. But my skin is healing well!             [facility] Adult PT Treatment/Exercise - 10/18/14 0001    Manual Therapy   Manual Lymphatic Drainage (MLD) In Supine: Short neck, Lt axilla and Rt inguinal nodes, superficial and deep abdominals, anterior inter-axillary and Rt axillo-inguinal anastomosis (avoiding tender radiated skin), and Rt UE from dorsal hand to lateral shoulder.                Plan - 10/18/14 0815    Clinical Impression Statement Pt back to doctor today to assess if she can do last 5 radiation boost sessions. Skin is still open and tender, and edema still present throughout Rt UE. Donned pts compression sleeve and glove, and glove was too easy to don, meaning not a snug fit.   Pt will benefit from skilled therapeutic intervention in order to improve on the following deficits Increased edema;Decreased strength   Rehab Potential Excellent   PT Next Visit Plan Reassess compression garments and circumferences. Discuss plans ?? continue until patient is healed  enough to use her flexitouch and nightime garment   PT Plan Continue complete decongestive therapy                               Problem List Patient Active Problem List   Diagnosis Date Noted  . Cancer of right breast 09/07/2014  . History of lymph node dissection of right axilla 09/07/2014  . Cellulitis left chest 09/07/2014  . H/O bilateral mastectomy 09/07/2014    [name], PTA 10/18/2014, 8:56 AM

## 2014-10-21 ENCOUNTER — Ambulatory Visit: Payer: BC Managed Care – PPO | Admitting: Physical Therapy

## 2014-10-21 DIAGNOSIS — Z9889 Other specified postprocedural states: Secondary | ICD-10-CM

## 2014-10-21 DIAGNOSIS — Z5189 Encounter for other specified aftercare: Secondary | ICD-10-CM | POA: Diagnosis not present

## 2014-10-21 DIAGNOSIS — Z9013 Acquired absence of bilateral breasts and nipples: Secondary | ICD-10-CM

## 2014-10-21 DIAGNOSIS — C50911 Malignant neoplasm of unspecified site of right female breast: Secondary | ICD-10-CM

## 2014-10-21 NOTE — Therapy (Signed)
[place], [place], [address] Phone: [phone]   Fax:  [phone]  Physical Therapy Treatment  Patient Details  Name: Meredith Fernandez MRN: [id number] Date of Birth: 1951/09/27  Encounter Date: 10/21/2014      PT End of Session - 10/21/14 1838    Visit Number 27   Number of Visits 32   Date for PT Re-Evaluation 10/25/14   PT Start Time 0800   PT Stop Time 0845   PT Time Calculation (min) 45 min      Past Medical History  Diagnosis Date  . Cancer     No past surgical history on file.  There were no vitals taken for this visit.  Visit Diagnosis:  H/O bilateral mastectomy  Cancer of right breast  History of lymph node dissection of right axilla      Subjective Assessment - 10/21/14 0806    Symptoms has not radiation again this week, but goes back Monday to see if she will be able to finish.  Still hasn't received nighttime garment and continues ot have a crase at wrist when wearing sleeve with swelling in hand.  Developed and pulled a hangnail on middle finger over the weekend and now has a swollen tender spots at thumb side of cuticle. She reports that she is using an antibiotic ointment and bandaid   Currently in Pain? No/denies        Treatment: Manual lymph drainage in supine as follows: short neck, left axillary nodes, right inguinal nodes, superficial and deep abdominals; anterior inter-axillary anastamoses, right axillo-inguinal anastamoses. Right upper extremity from fingers and dorsal hand to lateral shoulder redirecting along pathways.  Reapplied compression sleeve and glove          Long Term Clinic Goals - 10/21/14 1840    CC Long Term Goal  #3   Title Patient will be able to be independent with a home exercise program.   Period Weeks   Status On-going   CC Long Term Goal  #5   Title Patient will be able to increase Rt shoulder active abduction range of motion to  >/= 140 degrees tor increased functional use of arm.    Time 4   Period Weeks   Status On-going   CC Long Term Goal  #6   Title Patient will be able to increase Rt shoulder active flexion range of motion to >/= 160 degrees for increased functional use of arm.   Time 4   Period Weeks   Status On-going            Plan - 10/21/14 1838    Clinical Impression Statement Rexford Maus here to deliver nighttime garment which hopefully will reduce forearm swelling so sleeve and glove will fit properly.  If this works, then will be able to d/c after next visit   PT Next Visit Plan Remeasure, address goals and d/c if appropriate        Problem List Patient Active Problem List   Diagnosis Date Noted  . Cancer of right breast 09/07/2014  . History of lymph node dissection of right axilla 09/07/2014  . Cellulitis left chest 09/07/2014  . H/O bilateral mastectomy 09/07/2014   Donato Heinz. Owens Shark PT   Norwood Levo 10/21/2014, 6:41 PM  [place], [place], [address] Phone: [phone]   Fax:  [phone]

## 2014-11-03 ENCOUNTER — Ambulatory Visit: Payer: BC Managed Care – PPO | Admitting: Physical Therapy

## 2014-11-03 DIAGNOSIS — Z9889 Other specified postprocedural states: Secondary | ICD-10-CM

## 2014-11-03 DIAGNOSIS — Z5189 Encounter for other specified aftercare: Secondary | ICD-10-CM | POA: Diagnosis not present

## 2014-11-03 DIAGNOSIS — C50911 Malignant neoplasm of unspecified site of right female breast: Secondary | ICD-10-CM

## 2014-11-03 DIAGNOSIS — Z9013 Acquired absence of bilateral breasts and nipples: Secondary | ICD-10-CM

## 2014-11-03 NOTE — Therapy (Addendum)
[place], [place], [address] Phone: [phone]   Fax:  [phone]  Physical Therapy Treatment  Patient Details  Name: Meredith Fernandez MRN: [id number] Date of Birth: 10-21-1951  Encounter Date: 11/03/2014      PT End of Session - 11/03/14 1741    Visit Number 28   Number of Visits 32   Date for PT Re-Evaluation 10/25/14   PT Start Time 1440   PT Stop Time 1515   PT Time Calculation (min) 35 min      Past Medical History  Diagnosis Date  . Cancer     No past surgical history on file.  There were no vitals taken for this visit.  Visit Diagnosis:  H/O bilateral mastectomy  Cancer of right breast  History of lymph node dissection of right axilla      Subjective Assessment - 11/03/14 1535    Symptoms "Still not happy with my glove"  pt comes in today with sleeve and glove on .  Her sleeve does not appear to be high enough up on her arm and there continues to be fullness at posterior forearm just below elbow. and also at anterior forearm.  She has firmness at anterior forearm.     Currently in Pain? Yes             LYMPHEDEMA/ONCOLOGY QUESTIONNAIRE - 11/03/14 1538    Right Upper Extremity Lymphedema   10 cm Proximal to Olecranon Process 33.7 cm   Olecranon Process 26.9 cm   15 cm Proximal to Ulnar Styloid Process 28.3 cm   10 cm Proximal to Ulnar Styloid Process 24.7 cm   Just Proximal to Ulnar Styloid Process 18.3 cm   Across Hand at [employer] 21.2 cm   At [place] of 2nd Digit 6.8 cm                          Short Term Clinic Goals - 11/03/14 1744    CC Short Term Goal  #1   Title Patient will be able to report overall pain decreased >/= 25% to tolerate daily tasks with less pain.   Status Achieved   CC Short Term Goal  #2   Title Patient will be able to reduce edema by 1.5 cm at 10cm proximal to Rt ulnar styloid.   Status Achieved   CC Short Term  Goal  #3   Title Patient will be able to increase Rt shoulder shoulder active flexion range of motionto >/= 140 degrees for increased functional use of arm.    Status Achieved   CC Short Term Goal  #4   Title Patient will be able to increase Rt shoulder active abduction range of motion to >/= 120 degrees for increased functional use of arm.    Status Achieved             Long Term Clinic Goals - 11/03/14 1744    CC Long Term Goal  #1   Title Patient will be able to verbalize good understanding of the Maintenance Phase of treatment including manual lymph drainage, use of compression, and lymphedema risk reduction practices.    Status Achieved   CC Long Term Goal  #2   Title Patient will be able to reduce edema by 2.5 cm at 10 cm proximal to Rt ulnar styloid.    Status Achieved   CC Long Term Goal  #3   Title Patient  will be able to be independent with a home exercise program.   Status Achieved   CC Long Term Goal  #4   Title Patient will be able to report overall pain decreased >/= 50% to tolerate daily tasks with less pain.    Status Achieved   CC Long Term Goal  #5   Title Patient will be able to increase Rt shoulder active abduction range of motion to >/= 140 degrees tor increased functional use of arm.    Status Partially Met   CC Long Term Goal  #6   Title Patient will be able to increase Rt shoulder active flexion range of motion to >/= 160 degrees for increased functional use of arm.   Status Partially Met            Plan - 11/03/14 1741    Clinical Impression Statement Remeasured arm and pt continues to experience decreases.  Gave her and Oncologist and discussed "Alps Donning Lotion" "It Stays" and possible donning aids.  She feels that she is ready to discontinue this episode.  Will discharge from PT        Problem List Patient Active Problem List   Diagnosis Date Noted  . Cancer of right breast 09/07/2014  . History of lymph node dissection of  right axilla 09/07/2014  . Cellulitis left chest 09/07/2014  . H/O bilateral mastectomy 09/07/2014     PHYSICAL THERAPY DISCHARGE SUMMARY  Visits from Start of Care: 28  Current functional level related to goals / functional outcomes: Pt is able to manage her lymphedema at home   Remaining deficits: Lymphedema in right arm   Education / Equipment: Self manual lymph drainage, home exercise, use of compression garments Plan: Patient agrees to discharge.  Patient goals were partially met. Patient is being discharged due to being pleased with the current functional level.  ?????       Donato Heinz. Owens Shark, PT   11/03/2014, [place], [place], [address] Phone: [phone]   Fax:  [phone]

## 2019-01-14 NOTE — Progress Notes (Signed)
[facility] Note  01/16/2019     CHIEF COMPLAINT Patient presents for Retina Evaluation   HISTORY OF PRESENT ILLNESS: Meredith Fernandez is a 68 y.o. female who presents to the clinic today for:   HPI    Retina Evaluation    In left eye.  This started 1.5 weeks ago.  Duration of 1.5 weeks.  Associated Symptoms Flashes and Floaters.  Context:  distance vision, mid-range vision and near vision.  Treatments tried include no treatments.  I, the attending physician,  performed the HPI with the patient and updated documentation appropriately.          Comments    68 y/o female pt referred by Dr. Shirleen Schirmer for eval of PVD/VH OS.  Saw Dr. Katy Fitch about 1.5 wks ago, after she had sudden onset temporal flashes and several large dark floaters OS the night before.  Flashes only lasted about a minute, and pt has not experienced any since.  Floaters have also mostly resolved.  Pt has had some intermittent minor pain and sensation of pressure OS since this began.  Pt feels VA OS has decreased somewhat as well.  VA good OD, but does have early cataract.  No gtts.       Last edited by Bernarda Caffey, MD on 01/16/2019  9:52 AM. (History)    pt states she saw Dr. Katy Fitch on Monday bc she was seeing a new, large floater in her left eye vision, she states she went back on Tuesday and saw Dr. Midge Aver bc she had several new floaters as well as flashing lights, she states the lights and second set of floaters she saw seem to have gotten better, but the original large floater has not, she denies hx of eye sx  Referring physician: Warden Fillers, MD [address] [place], [place] [address]  HISTORICAL INFORMATION:   Selected notes from the MEDICAL RECORD NUMBER Referred by Dr. Midge Aver for concern of VH/PVD OS LEE: 03.03.20 (C. Groat) [BCVA: OD: 20/40 OS: 20/30]  Ocular Hx-cataracts OU PMH-MS, breast cancer    CURRENT MEDICATIONS: No current outpatient medications  on file. (Ophthalmic Drugs)   No current facility-administered medications for this visit.  (Ophthalmic Drugs)   Current Outpatient Medications (Other)  Medication Sig  . hydrochlorothiazide (HYDRODIURIL) 25 MG tablet Take 25 mg by mouth daily.  . Multiple Vitamins-Minerals (MULTIVITAMIN WITH MINERALS) tablet Take by mouth.  Marland Kitchen omeprazole (PRILOSEC) 40 MG capsule Take by mouth.  . silver sulfADIAZINE (SILVADENE) 1 % cream Apply 1 application topically daily.  . tamoxifen (NOLVADEX) 20 MG tablet Take by mouth.   No current facility-administered medications for this visit.  (Other)      REVIEW OF SYSTEMS: ROS    Positive for: Eyes   Negative for: Constitutional, Gastrointestinal, Neurological, Skin, Genitourinary, Musculoskeletal, HENT, Endocrine, Cardiovascular, Respiratory, Psychiatric, Allergic/Imm, Heme/Lymph   Last edited by Matthew Folks, COA on 01/16/2019  9:14 AM. (History)       ALLERGIES No Known Allergies  PAST MEDICAL HISTORY Past Medical History:  Diagnosis Date  . Cancer ([place])   . Cataract    OD   History reviewed. No pertinent surgical history.  FAMILY HISTORY History reviewed. No pertinent family history.  SOCIAL HISTORY Social History   Tobacco Use  . Smoking status: Never Smoker  . Smokeless tobacco: Never Used  Substance Use Topics  . Alcohol use: Not on file  . Drug use: Not on file  OPHTHALMIC EXAM:  Base Eye Exam    Visual Acuity (Snellen - Linear)      Right Left   Dist cc 20/40 20/20   Dist ph cc 20/30    Correction:  Glasses       Tonometry (Tonopen, 9:15 AM)      Right Left   Pressure 15 15       Pupils      Dark Light Shape React APD   Right 3 2 Round Brisk None   Left 3 2 Round Brisk None       Visual Fields (Counting fingers)      Left Right    Full Full       Extraocular Movement      Right Left    Full, Ortho Full, Ortho       Neuro/Psych    Oriented x3:  Yes   Mood/Affect:  Normal        Dilation    Both eyes:  1.0% Mydriacyl, 2.5% Phenylephrine @ 9:15 AM        Slit Lamp and Fundus Exam    Slit Lamp Exam      Right Left   Lids/Lashes Dermatochalasis - upper lid Dermatochalasis - upper lid   Conjunctiva/Sclera White and quiet White and quiet   Cornea 2+ Punctate epithelial erosions 2+ Punctate epithelial erosions   Anterior Chamber deep and clear deep and clear   Iris Round and dilated Round and dilated   Lens 2+ Nuclear sclerosis, 2+ Cortical cataract 2+ Nuclear sclerosis, 2+ Cortical cataract   Vitreous mild Vitreous syneresis mild Vitreous syneresis, Posterior vitreous detachment, vitreous condensations       Fundus Exam      Right Left   Disc mild tilt, temporal PPA tilted disc, PPA   C/D Ratio 0.5 0.3   Macula Flat, Good foveal reflex, mild RPE mottling and clumping Blunted foveal reflex, Epiretinal membrane   Vessels Vascular attenuation, AV crossing changes, Tortuousity Vascular attenuation, Tortuous -- ST venule most tortuous   Periphery Attached, mild cystoid degeneration and paving stones inferiorly Attached, pigmented cystoid degeneration inferiorly  , No RT/RD on 360 scleral depression         Refraction    Wearing Rx      Sphere Cylinder Axis Add   Right -9.00 +1.50 153 +2.25   Left -8.25 +0.75 039 +2.25   Age:  68yrs   Type:  PAL       Manifest Refraction      Sphere Cylinder Axis Dist VA   Right -9.00 +1.50 153 20/40   Left -8.25 +0.50 040 20/20          IMAGING AND PROCEDURES  Imaging and Procedures for @TODAY @  OCT, Retina - OU - Both Eyes       Right Eye Quality was good. Central Foveal Thickness: 255. Progression has no prior data. Findings include normal foveal contour, no SRF, no IRF (Partial PVD).   Left Eye Quality was good. Central Foveal Thickness: 298. Progression has no prior data. Findings include no SRF, no IRF, abnormal foveal contour, epiretinal membrane.   Notes *Images captured and stored on drive  Diagnosis  / Impression:  NFP, no IRF/SRF OU ERM OS   Clinical management:  See below  Abbreviations: NFP - Normal foveal profile. CME - cystoid macular edema. PED - pigment epithelial detachment. IRF - intraretinal fluid. SRF - subretinal fluid. EZ - ellipsoid zone. ERM - epiretinal membrane. ORA - outer retinal atrophy.  ORT - outer retinal tubulation. SRHM - subretinal hyper-reflective material                 ASSESSMENT/PLAN:    ICD-10-CM   1. Posterior vitreous detachment of left eye H43.812   2. Epiretinal membrane (ERM) of left eye H35.372   3. Retinal edema H35.81 OCT, Retina - OU - Both Eyes  4. Combined forms of age-related cataract of both eyes H25.813   5. Myopia of both eyes with astigmatism H52.13    H52.203     1. PVD / vitreous syneresis OS  - subacute with onset ~2 wks ago -- symptoms improving  - No RT or RD on 360 scleral depressed exam  - Discussed findings and prognosis  - Reviewed s/s of RT/RD  - Strict return precautions for any such RT/RD signs/symptoms  - f/u 4-6 weeks  2,3. Epiretinal membrane, OS  - The natural history, anatomy, potential for loss of vision, and treatment options including vitrectomy techniques and the complications of endophthalmitis, retinal detachment, vitreous hemorrhage, cataract progression and permanent vision loss discussed with the patient.  - mild ERM  - asymptomatic, no metamorphopsia  - no indication for surgery at this time  - monitor for now  - f/u 3 mos  4. Mixed form age related cataract OU  - The symptoms of cataract, surgical options, and treatments and risks were discussed with patient.  - discussed diagnosis and progression  - not yet visually significant  - monitor for now  5. High myopia w/ astigmatism  - monitor   Ophthalmic Meds Ordered this visit:  No orders of the defined types were placed in this encounter.      Return for f/u 4-6 weeks, PVD OS, DFE, OCT.  There are no Patient Instructions on  file for this visit.   Explained the diagnoses, plan, and follow up with the patient and they expressed understanding.  Patient expressed understanding of the importance of proper follow up care.   This document serves as a record of services personally performed by Gardiner Sleeper, MD, PhD. It was created on their behalf by Ernest Mallick, OA, an ophthalmic assistant. The creation of this record is the provider's dictation and/or activities during the visit.    Electronically signed by: Ernest Mallick, OA  03.11.2020 8:44 PM    Gardiner Sleeper, M.D., Ph.D. Diseases & Surgery of the Retina and Vitreous [facility]  I have reviewed the above documentation for accuracy and completeness, and I agree with the above. Gardiner Sleeper, M.D., Ph.D. 01/19/19 8:44 PM    Abbreviations: M myopia (nearsighted); A astigmatism; H hyperopia (farsighted); P presbyopia; Mrx spectacle prescription;  CTL contact lenses; OD right eye; OS left eye; OU both eyes  XT exotropia; ET esotropia; PEK punctate epithelial keratitis; PEE punctate epithelial erosions; DES dry eye syndrome; MGD meibomian gland dysfunction; ATs artificial tears; PFAT's preservative free artificial tears; [place] nuclear sclerotic cataract; PSC posterior subcapsular cataract; ERM epi-retinal membrane; PVD posterior vitreous detachment; RD retinal detachment; DM diabetes mellitus; DR diabetic retinopathy; NPDR non-proliferative diabetic retinopathy; PDR proliferative diabetic retinopathy; CSME clinically significant macular edema; DME diabetic macular edema; dbh dot blot hemorrhages; CWS cotton wool spot; POAG primary open angle glaucoma; C/D cup-to-disc ratio; HVF humphrey visual field; GVF goldmann visual field; OCT optical coherence tomography; IOP intraocular pressure; BRVO Branch retinal vein occlusion; CRVO central retinal vein occlusion; CRAO central retinal artery occlusion; BRAO branch retinal artery occlusion; RT retinal  tear;  SB scleral buckle; PPV pars plana vitrectomy; VH Vitreous hemorrhage; PRP panretinal laser photocoagulation; IVK intravitreal kenalog; VMT vitreomacular traction; MH Macular hole;  NVD neovascularization of the disc; NVE neovascularization elsewhere; AREDS age related eye disease study; ARMD age related macular degeneration; POAG primary open angle glaucoma; EBMD epithelial/anterior basement membrane dystrophy; ACIOL anterior chamber intraocular lens; IOL intraocular lens; PCIOL posterior chamber intraocular lens; Phaco/IOL phacoemulsification with intraocular lens placement; [place] photorefractive keratectomy; LASIK laser assisted in situ keratomileusis; HTN hypertension; DM diabetes mellitus; COPD chronic obstructive pulmonary disease

## 2019-01-16 ENCOUNTER — Other Ambulatory Visit: Payer: Self-pay

## 2019-01-16 ENCOUNTER — Ambulatory Visit (INDEPENDENT_AMBULATORY_CARE_PROVIDER_SITE_OTHER): Payer: Medicare HMO | Admitting: Ophthalmology

## 2019-01-16 ENCOUNTER — Encounter (INDEPENDENT_AMBULATORY_CARE_PROVIDER_SITE_OTHER): Payer: Self-pay | Admitting: Ophthalmology

## 2019-01-16 DIAGNOSIS — H43812 Vitreous degeneration, left eye: Secondary | ICD-10-CM | POA: Diagnosis not present

## 2019-01-16 DIAGNOSIS — H35372 Puckering of macula, left eye: Secondary | ICD-10-CM

## 2019-01-16 DIAGNOSIS — H3581 Retinal edema: Secondary | ICD-10-CM

## 2019-01-16 DIAGNOSIS — H52203 Unspecified astigmatism, bilateral: Secondary | ICD-10-CM

## 2019-01-16 DIAGNOSIS — H5213 Myopia, bilateral: Secondary | ICD-10-CM

## 2019-01-16 DIAGNOSIS — H25813 Combined forms of age-related cataract, bilateral: Secondary | ICD-10-CM

## 2019-01-19 ENCOUNTER — Encounter (INDEPENDENT_AMBULATORY_CARE_PROVIDER_SITE_OTHER): Payer: Self-pay | Admitting: Ophthalmology

## 2019-02-20 ENCOUNTER — Encounter (INDEPENDENT_AMBULATORY_CARE_PROVIDER_SITE_OTHER): Payer: Medicare HMO | Admitting: Ophthalmology

## 2019-04-27 NOTE — Progress Notes (Signed)
[facility] Note  04/28/2019     CHIEF COMPLAINT Patient presents for Retina Follow Up   HISTORY OF PRESENT ILLNESS: Meredith Fernandez is a 68 y.o. female who presents to the clinic today for:   HPI    Retina Follow Up    Patient presents with  PVD.  In left eye.  This started 3 months ago.  Severity is moderate.  I, the attending physician,  performed the HPI with the patient and updated documentation appropriately.          Comments    Patient here for 3 months retina follow up for PVD OS. Patient states vision doing ok. No eye pain. Has dryness and itching allergies.        Last edited by Bernarda Caffey, MD on 04/28/2019  8:55 AM. (History)    pt states the big floater she had in her left eye is still there, she states it is bothersome bc it catches the light and looks like an oil drop, she denies flashes of light  Referring physician: No referring provider defined for this encounter.  HISTORICAL INFORMATION:   Selected notes from the MEDICAL RECORD NUMBER Referred by Dr. Midge Aver for concern of VH/PVD OS LEE: 03.03.20 (C. Groat) [BCVA: OD: 20/40 OS: 20/30]  Ocular Hx-cataracts OU PMH-MS, breast cancer    CURRENT MEDICATIONS: No current outpatient medications on file. (Ophthalmic Drugs)   No current facility-administered medications for this visit.  (Ophthalmic Drugs)   Current Outpatient Medications (Other)  Medication Sig  . hydrochlorothiazide (HYDRODIURIL) 25 MG tablet Take 25 mg by mouth daily.  . Multiple Vitamins-Minerals (MULTIVITAMIN WITH MINERALS) tablet Take by mouth.  Marland Kitchen omeprazole (PRILOSEC) 40 MG capsule Take by mouth.  . silver sulfADIAZINE (SILVADENE) 1 % cream Apply 1 application topically daily.  . tamoxifen (NOLVADEX) 20 MG tablet Take by mouth.   No current facility-administered medications for this visit.  (Other)      REVIEW OF SYSTEMS: ROS    Positive for: Eyes   Negative for: Constitutional,  Gastrointestinal, Neurological, Skin, Genitourinary, Musculoskeletal, HENT, Endocrine, Cardiovascular, Respiratory, Psychiatric, Allergic/Imm, Heme/Lymph   Last edited by Theodore Demark on 04/28/2019  8:43 AM. (History)       ALLERGIES No Known Allergies  PAST MEDICAL HISTORY Past Medical History:  Diagnosis Date  . Cancer ([place])   . Cataract    OD   History reviewed. No pertinent surgical history.  FAMILY HISTORY History reviewed. No pertinent family history.  SOCIAL HISTORY Social History   Tobacco Use  . Smoking status: Never Smoker  . Smokeless tobacco: Never Used  Substance Use Topics  . Alcohol use: Not on file  . Drug use: Not on file         OPHTHALMIC EXAM:  Base Eye Exam    Visual Acuity (Snellen - Linear)      Right Left   Dist cc 20/40 20/20   Dist ph cc 20/20 -2    Correction: Glasses       Tonometry (Tonopen, 8:40 AM)      Right Left   Pressure 22 21       Pupils      Dark Light Shape React APD   Right 3 2 Round Brisk None   Left 3 2 Round Brisk None       Visual Fields (Counting fingers)      Left Right    Full Full  Extraocular Movement      Right Left    Full, Ortho Full, Ortho       Neuro/Psych    Oriented x3: Yes   Mood/Affect: Normal       Dilation    Both eyes: 1.0% Mydriacyl, 2.5% Phenylephrine @ 8:40 AM        Slit Lamp and Fundus Exam    Slit Lamp Exam      Right Left   Lids/Lashes Dermatochalasis - upper lid Dermatochalasis - upper lid   Conjunctiva/Sclera White and quiet White and quiet   Cornea 1+ Punctate epithelial erosions, Arcus 2+ Punctate epithelial erosions   Anterior Chamber deep and clear deep and clear   Iris Round and dilated Round and dilated   Lens 2+ Nuclear sclerosis, 2+ Cortical cataract 2+ Nuclear sclerosis, 2+ Cortical cataract   Vitreous mild Vitreous syneresis mild Vitreous syneresis, Posterior vitreous detachment settling inferior to disc       Fundus Exam      Right Left    Disc mild tilt, temporal PPA tilted disc, +PPA   C/D Ratio 0.5 0.3   Macula Flat, blunted foveal reflex, mild RPE mottling and clumping, No heme or edema Flat, Blunted foveal reflex, Epiretinal membrane, Retinal pigment epithelial mottling, No heme or edema   Vessels Vascular attenuation, Tortuous, AV crossing changes Vascular attenuation, Tortuous   Periphery Attached, mild cystoid degeneration and paving stones inferiorly Attached, pigmented cystoid degeneration inferiorly, No RT/RD on 360 exam        Refraction    Wearing Rx      Sphere Cylinder Axis Add   Right -9.00 +1.50 153 +2.25   Left -8.25 +0.75 039 +2.25   Type: PAL          IMAGING AND PROCEDURES  Imaging and Procedures for @TODAY @  OCT, Retina - OU - Both Eyes       Right Eye Quality was good. Central Foveal Thickness: 257. Progression has been stable. Findings include normal foveal contour, no SRF, no IRF (Partial PVD).   Left Eye Quality was good. Central Foveal Thickness: 276. Progression has improved. Findings include no SRF, no IRF, epiretinal membrane, normal foveal contour, macular pucker (Interval improvement in foveal contour).   Notes *Images captured and stored on drive  Diagnosis / Impression:  NFP, no IRF/SRF OU ERM OS -- Interval improvement in foveal contour   Clinical management:  See below  Abbreviations: NFP - Normal foveal profile. CME - cystoid macular edema. PED - pigment epithelial detachment. IRF - intraretinal fluid. SRF - subretinal fluid. EZ - ellipsoid zone. ERM - epiretinal membrane. ORA - outer retinal atrophy. ORT - outer retinal tubulation. SRHM - subretinal hyper-reflective material                 ASSESSMENT/PLAN:    ICD-10-CM   1. Posterior vitreous detachment of left eye  H43.812   2. Epiretinal membrane (ERM) of left eye  H35.372   3. Retinal edema  H35.81 OCT, Retina - OU - Both Eyes  4. Combined forms of age-related cataract of both eyes  H25.813   5.  Myopia of both eyes with astigmatism  H52.13    H52.203     1. PVD / vitreous syneresis OS  - subacute with early March 2020 onset -- symptoms improving  - No RT or RD on 360 scleral depressed exam  - Discussed findings and prognosis  - Reviewed s/s of RT/RD  - Strict return precautions for any such  RT/RD signs/symptoms  - f/u 6 mos  2,3. Epiretinal membrane, OS  - mild ERM -- interval improvement in foveal profile  - asymptomatic, no metamorphopsia  - no indication for surgery at this time  - monitor for now  - f/u 6 mos  4. Mixed form age related cataract OU  - The symptoms of cataract, surgical options, and treatments and risks were discussed with patient.  - discussed diagnosis and progression  - not yet visually significant  - monitor for now  5. High myopia w/ astigmatism  - monitor   Ophthalmic Meds Ordered this visit:  No orders of the defined types were placed in this encounter.      Return in about 6 months (around 10/28/2019) for f/u ERM OS, DFE, OCT.  There are no Patient Instructions on file for this visit.   Explained the diagnoses, plan, and follow up with the patient and they expressed understanding.  Patient expressed understanding of the importance of proper follow up care.   This document serves as a record of services personally performed by Gardiner Sleeper, MD, PhD. It was created on their behalf by Ernest Mallick, OA, an ophthalmic assistant. The creation of this record is the provider's dictation and/or activities during the visit.    Electronically signed by: Ernest Mallick, OA  06.22.2020 9:21 AM     Gardiner Sleeper, M.D., Ph.D. Diseases & Surgery of the Retina and Vitreous [facility]  I have reviewed the above documentation for accuracy and completeness, and I agree with the above. Gardiner Sleeper, M.D., Ph.D. 04/28/19 9:21 AM    Abbreviations: M myopia (nearsighted); A astigmatism; H hyperopia (farsighted); P  presbyopia; Mrx spectacle prescription;  CTL contact lenses; OD right eye; OS left eye; OU both eyes  XT exotropia; ET esotropia; PEK punctate epithelial keratitis; PEE punctate epithelial erosions; DES dry eye syndrome; MGD meibomian gland dysfunction; ATs artificial tears; PFAT's preservative free artificial tears; [place] nuclear sclerotic cataract; PSC posterior subcapsular cataract; ERM epi-retinal membrane; PVD posterior vitreous detachment; RD retinal detachment; DM diabetes mellitus; DR diabetic retinopathy; NPDR non-proliferative diabetic retinopathy; PDR proliferative diabetic retinopathy; CSME clinically significant macular edema; DME diabetic macular edema; dbh dot blot hemorrhages; CWS cotton wool spot; POAG primary open angle glaucoma; C/D cup-to-disc ratio; HVF humphrey visual field; GVF goldmann visual field; OCT optical coherence tomography; IOP intraocular pressure; BRVO Branch retinal vein occlusion; CRVO central retinal vein occlusion; CRAO central retinal artery occlusion; BRAO branch retinal artery occlusion; RT retinal tear; SB scleral buckle; PPV pars plana vitrectomy; VH Vitreous hemorrhage; PRP panretinal laser photocoagulation; IVK intravitreal kenalog; VMT vitreomacular traction; MH Macular hole;  NVD neovascularization of the disc; NVE neovascularization elsewhere; AREDS age related eye disease study; ARMD age related macular degeneration; POAG primary open angle glaucoma; EBMD epithelial/anterior basement membrane dystrophy; ACIOL anterior chamber intraocular lens; IOL intraocular lens; PCIOL posterior chamber intraocular lens; Phaco/IOL phacoemulsification with intraocular lens placement; [place] photorefractive keratectomy; LASIK laser assisted in situ keratomileusis; HTN hypertension; DM diabetes mellitus; COPD chronic obstructive pulmonary disease

## 2019-04-28 ENCOUNTER — Other Ambulatory Visit: Payer: Self-pay

## 2019-04-28 ENCOUNTER — Encounter (INDEPENDENT_AMBULATORY_CARE_PROVIDER_SITE_OTHER): Payer: Self-pay | Admitting: Ophthalmology

## 2019-04-28 ENCOUNTER — Ambulatory Visit (INDEPENDENT_AMBULATORY_CARE_PROVIDER_SITE_OTHER): Payer: Medicare HMO | Admitting: Ophthalmology

## 2019-04-28 DIAGNOSIS — H35372 Puckering of macula, left eye: Secondary | ICD-10-CM

## 2019-04-28 DIAGNOSIS — H3581 Retinal edema: Secondary | ICD-10-CM | POA: Diagnosis not present

## 2019-04-28 DIAGNOSIS — H43812 Vitreous degeneration, left eye: Secondary | ICD-10-CM

## 2019-04-28 DIAGNOSIS — H5213 Myopia, bilateral: Secondary | ICD-10-CM

## 2019-04-28 DIAGNOSIS — H52203 Unspecified astigmatism, bilateral: Secondary | ICD-10-CM

## 2019-04-28 DIAGNOSIS — H25813 Combined forms of age-related cataract, bilateral: Secondary | ICD-10-CM | POA: Diagnosis not present

## 2019-10-21 NOTE — Progress Notes (Addendum)
[facility] Note  10/28/2019     CHIEF COMPLAINT Patient presents for Retina Follow Up   HISTORY OF PRESENT ILLNESS: Meredith Fernandez is a 68 y.o. female who presents to the clinic today for:   HPI    Retina Follow Up    Patient presents with  PVD.  In left eye.  This started 9 months ago.  Severity is mild.  Duration of 6 months.  Since onset it is rapidly improving.  I, the attending physician,  performed the HPI with the patient and updated documentation appropriately.          Comments    68 y/o female pt here for 6 mo f/u for PVD and ERM of the left eye.  No noticed change in [place] in either eye.  Denies pain, flashes, floaters, but has noticed a small clear "bubble" on her conj. Temporally left eye, which has been there for a few mos, and causes her some irritation.  OTC AT prn both eyes.       Last edited by Bernarda Caffey, MD on 10/29/2019 12:06 PM. (History)    pt states she still sees that same floater, she states it is more prominent in the morning, she states she sees a "bubble" on the outer part of her eye that is yellowish in color   Referring physician: Warden Fillers, [address] [place],  [place] [id number]  HISTORICAL INFORMATION:   Selected notes from the MEDICAL RECORD NUMBER Referred by Dr. Midge Aver for concern of VH/PVD OS LEE: 03.03.20 (C. Groat) [BCVA: OD: 20/40 OS: 20/30]  Ocular Hx-cataracts OU PMH-MS, breast cancer    CURRENT MEDICATIONS: No current outpatient medications on file. (Ophthalmic Drugs)   No current facility-administered medications for this visit. (Ophthalmic Drugs)   Current Outpatient Medications (Other)  Medication Sig  . hydrochlorothiazide (HYDRODIURIL) 25 MG tablet Take 25 mg by mouth daily.  . Multiple Vitamins-Minerals (MULTIVITAMIN WITH MINERALS) tablet Take by mouth.  Marland Kitchen omeprazole (PRILOSEC) 40 MG capsule Take by mouth.  . pravastatin (PRAVACHOL) 10 MG tablet   . silver  sulfADIAZINE (SILVADENE) 1 % cream Apply 1 application topically daily.  . tamoxifen (NOLVADEX) 20 MG tablet Take by mouth.   No current facility-administered medications for this visit. (Other)      REVIEW OF SYSTEMS: ROS    Positive for: Eyes   Negative for: Constitutional, Gastrointestinal, Neurological, Skin, Genitourinary, Musculoskeletal, HENT, Endocrine, Cardiovascular, Respiratory, Psychiatric, Allergic/Imm, Heme/Lymph   Last edited by Matthew Folks, COA on 10/28/2019  8:52 AM. (History)       ALLERGIES No Known Allergies  PAST MEDICAL HISTORY Past Medical History:  Diagnosis Date  . Cancer ([place])   . Cataract    OD   History reviewed. No pertinent surgical history.  FAMILY HISTORY History reviewed. No pertinent family history.  SOCIAL HISTORY Social History   Tobacco Use  . Smoking status: Never Smoker  . Smokeless tobacco: Never Used  Substance Use Topics  . Alcohol use: Not on file  . Drug use: Not on file         OPHTHALMIC EXAM:  Base Eye Exam    Visual Acuity (Snellen - Linear)      Right Left   Dist cc 20/20 - 20/20 -2   Correction: Glasses       Tonometry (Tonopen, 8:56 AM)      Right Left   Pressure 17 18  Pupils      Dark Light Shape React APD   Right 3 2 Round Brisk None   Left 3 2 Round Brisk None       Visual Fields (Counting fingers)      Left Right    Full Full       Extraocular Movement      Right Left    Full, Ortho Full, Ortho       Neuro/Psych    Oriented x3: Yes   Mood/Affect: Normal       Dilation    Both eyes: 1.0% Mydriacyl, 2.5% Phenylephrine @ 8:56 AM        Slit Lamp and Fundus Exam    Slit Lamp Exam      Right Left   Lids/Lashes Dermatochalasis - upper lid Dermatochalasis - upper lid   Conjunctiva/Sclera White and quiet Conjunctival Cyst at 0430; otherwise white and quiet   Cornea 1+ Punctate epithelial erosions, Arcus 1+ Punctate epithelial erosions   Anterior Chamber deep and clear  deep and clear   Iris Round and dilated Round and dilated   Lens 2+ Nuclear sclerosis, 2+ Cortical cataract 2+ Nuclear sclerosis, 2+ Cortical cataract   Vitreous mild Vitreous syneresis mild Vitreous syneresis, Posterior vitreous detachment, Weiss ring settled inferiorly       Fundus Exam      Right Left   Disc mild tilt, temporal PPA, Pink and Sharp tilted disc, +PPA   C/D Ratio 0.5 0.3   Macula Flat, blunted foveal reflex, mild RPE mottling and clumping, No heme or edema Flat, Blunted foveal reflex, mild Epiretinal membrane SN, Retinal pigment epithelial mottling and clumping, No heme or edema   Vessels Vascular attenuation, Tortuous, mild AV crossing changes Vascular attenuation, Tortuous, superior venules>infeior   Periphery Attached, mild cystoid degeneration and paving stones inferiorly Attached, pigmented cystoid degeneration inferiorly, No RT/RD on 360 exam          IMAGING AND PROCEDURES  Imaging and Procedures for @TODAY @  OCT, Retina - OU - Both Eyes       Right Eye Quality was good. Central Foveal Thickness: 252. Progression has been stable. Findings include normal foveal contour, no SRF, no IRF (Partial PVD).   Left Eye Quality was good. Central Foveal Thickness: 266. Progression has been stable. Findings include no SRF, no IRF, epiretinal membrane, normal foveal contour, macular pucker (Stable improvement in foveal contour).   Notes *Images captured and stored on drive  Diagnosis / Impression:  NFP, no IRF/SRF OU ERM OS -- stable improvement in foveal contour   Clinical management:  See below  Abbreviations: NFP - Normal foveal profile. CME - cystoid macular edema. PED - pigment epithelial detachment. IRF - intraretinal fluid. SRF - subretinal fluid. EZ - ellipsoid zone. ERM - epiretinal membrane. ORA - outer retinal atrophy. ORT - outer retinal tubulation. SRHM - subretinal hyper-reflective material                 ASSESSMENT/PLAN:    ICD-10-CM    1. Posterior vitreous detachment of left eye  H43.812   2. Epiretinal membrane (ERM) of left eye  H35.372   3. Retinal edema  H35.81 OCT, Retina - OU - Both Eyes  4. Combined forms of age-related cataract of both eyes  H25.813   5. Myopia of both eyes with astigmatism  H52.13    H52.203     1. PVD / vitreous syneresis OS  - subacute with early March 2020 onset -- symptoms improved  -  No RT or RD on 360 scleral depressed exam  - Discussed findings and prognosis  - Reviewed s/s of RT/RD  - Strict return precautions for any such RT/RD signs/symptoms  - monitor   2,3. Epiretinal membrane, OS  - mild ERM -- stable improvement in foveal profile  - asymptomatic, no metamorphopsia  - no indication for surgery at this time  - monitor for now  - f/u 9 months  4. Mixed form age related cataract OU  - The symptoms of cataract, surgical options, and treatments and risks were discussed with patient.  - discussed diagnosis and progression  - not yet visually significant  - monitor for now  5. High myopia w/ astigmatism  - monitor   Ophthalmic Meds Ordered this visit:  No orders of the defined types were placed in this encounter.      Return in about 9 months (around 07/28/2020) for f/u ERM OS, DFE, OCT.  There are no Patient Instructions on file for this visit.   Explained the diagnoses, plan, and follow up with the patient and they expressed understanding.  Patient expressed understanding of the importance of proper follow up care.   This document serves as a record of services personally performed by Gardiner Sleeper, MD, PhD. It was created on their behalf by Roselee Nova, COMT. The creation of this record is the provider's dictation and/or activities during the visit.  Electronically signed by: Roselee Nova, COMT 10/29/19 12:09 PM   This document serves as a record of services personally performed by Gardiner Sleeper, MD, PhD. It was created on their behalf by Ernest Mallick, OA,  an ophthalmic assistant. The creation of this record is the provider's dictation and/or activities during the visit.    Electronically signed by: Ernest Mallick, OA 12.23.2020 12:09 PM  Gardiner Sleeper, M.D., Ph.D. Diseases & Surgery of the Retina and [place] 10/28/2019   I have reviewed the above documentation for accuracy and completeness, and I agree with the above. Gardiner Sleeper, M.D., Ph.D. 10/29/19 12:09 PM    Abbreviations: M myopia (nearsighted); A astigmatism; H hyperopia (farsighted); P presbyopia; Mrx spectacle prescription;  CTL contact lenses; OD right eye; OS left eye; OU both eyes  XT exotropia; ET esotropia; PEK punctate epithelial keratitis; PEE punctate epithelial erosions; DES dry eye syndrome; MGD meibomian gland dysfunction; ATs artificial tears; PFAT's preservative free artificial tears; [place] nuclear sclerotic cataract; PSC posterior subcapsular cataract; ERM epi-retinal membrane; PVD posterior vitreous detachment; RD retinal detachment; DM diabetes mellitus; DR diabetic retinopathy; NPDR non-proliferative diabetic retinopathy; PDR proliferative diabetic retinopathy; CSME clinically significant macular edema; DME diabetic macular edema; dbh dot blot hemorrhages; CWS cotton wool spot; POAG primary open angle glaucoma; C/D cup-to-disc ratio; HVF humphrey visual field; GVF goldmann visual field; OCT optical coherence tomography; IOP intraocular pressure; BRVO Branch retinal vein occlusion; CRVO central retinal vein occlusion; CRAO central retinal artery occlusion; BRAO branch retinal artery occlusion; RT retinal tear; SB scleral buckle; PPV pars plana vitrectomy; VH Vitreous hemorrhage; PRP panretinal laser photocoagulation; IVK intravitreal kenalog; VMT vitreomacular traction; MH Macular hole;  NVD neovascularization of the disc; NVE neovascularization elsewhere; AREDS age related eye disease study; ARMD age related macular degeneration; POAG  primary open angle glaucoma; EBMD epithelial/anterior basement membrane dystrophy; ACIOL anterior chamber intraocular lens; IOL intraocular lens; PCIOL posterior chamber intraocular lens; Phaco/IOL phacoemulsification with intraocular lens placement; [place] photorefractive keratectomy; LASIK laser assisted in situ keratomileusis; HTN hypertension; DM diabetes mellitus; COPD chronic  obstructive pulmonary disease

## 2019-10-28 ENCOUNTER — Encounter (INDEPENDENT_AMBULATORY_CARE_PROVIDER_SITE_OTHER): Payer: Self-pay | Admitting: Ophthalmology

## 2019-10-28 ENCOUNTER — Ambulatory Visit (INDEPENDENT_AMBULATORY_CARE_PROVIDER_SITE_OTHER): Payer: Medicare HMO | Admitting: Ophthalmology

## 2019-10-28 DIAGNOSIS — H43812 Vitreous degeneration, left eye: Secondary | ICD-10-CM | POA: Diagnosis not present

## 2019-10-28 DIAGNOSIS — H35372 Puckering of macula, left eye: Secondary | ICD-10-CM | POA: Diagnosis not present

## 2019-10-28 DIAGNOSIS — H3581 Retinal edema: Secondary | ICD-10-CM | POA: Diagnosis not present

## 2019-10-28 DIAGNOSIS — H25813 Combined forms of age-related cataract, bilateral: Secondary | ICD-10-CM

## 2019-10-28 DIAGNOSIS — H5213 Myopia, bilateral: Secondary | ICD-10-CM

## 2019-10-28 DIAGNOSIS — H52203 Unspecified astigmatism, bilateral: Secondary | ICD-10-CM

## 2019-10-29 ENCOUNTER — Encounter (INDEPENDENT_AMBULATORY_CARE_PROVIDER_SITE_OTHER): Payer: Self-pay | Admitting: Ophthalmology

## 2019-12-26 ENCOUNTER — Ambulatory Visit: Payer: Medicare HMO | Attending: Internal Medicine

## 2019-12-26 DIAGNOSIS — Z23 Encounter for immunization: Secondary | ICD-10-CM | POA: Insufficient documentation

## 2019-12-26 NOTE — Progress Notes (Signed)
   Covid-19 Vaccination Clinic  Name:  Ali Harralson    MRN: [id number] DOB: 1951/03/03  12/26/2019  Ms. Snarski was observed post Covid-19 immunization for 15 minutes without incidence. She was provided with Vaccine Information Sheet and instruction to access the V-Safe system.   Ms. Zaluski was instructed to call 911 with any severe reactions post vaccine: Marland Kitchen Difficulty breathing  . Swelling of your face and throat  . A fast heartbeat  . A bad rash all over your body  . Dizziness and weakness    Immunizations Administered    Name Date Dose VIS Date Route   Pfizer COVID-19 Vaccine 12/26/2019  1:58 PM 0.3 mL 10/16/2019 Intramuscular   Manufacturer: [place]   Lot: [id number]   [place]: [id number]

## 2020-01-18 ENCOUNTER — Ambulatory Visit: Payer: Medicare HMO | Attending: Internal Medicine

## 2020-01-18 DIAGNOSIS — Z23 Encounter for immunization: Secondary | ICD-10-CM

## 2020-01-18 NOTE — Progress Notes (Signed)
   Covid-19 Vaccination Clinic  Name:  Meredith Fernandez    MRN: [id number] DOB: Aug 23, 1951  01/18/2020  [name] was observed post Covid-19 immunization for 15 minutes without incident. She was provided with Vaccine Information Sheet and instruction to access the V-Safe system.   [name] was instructed to call 911 with any severe reactions post vaccine: Marland Kitchen Difficulty breathing  . Swelling of face and throat  . A fast heartbeat  . A bad rash all over body  . Dizziness and weakness   Immunizations Administered    Name Date Dose VIS Date Route   Pfizer COVID-19 Vaccine 01/18/2020 10:21 AM 0.3 mL 10/16/2019 Intramuscular   Manufacturer: [place]   Lot: [id number]   [place]: [id number]

## 2020-06-16 ENCOUNTER — Other Ambulatory Visit: Payer: Self-pay | Admitting: Gastroenterology

## 2020-06-16 DIAGNOSIS — K449 Diaphragmatic hernia without obstruction or gangrene: Secondary | ICD-10-CM

## 2020-06-22 ENCOUNTER — Ambulatory Visit (HOSPITAL_COMMUNITY)
Admission: RE | Admit: 2020-06-22 | Discharge: 2020-06-22 | Disposition: A | Discharge status: Home / Self Care | Payer: Medicare HMO | Source: Ambulatory Visit | Attending: Gastroenterology | Admitting: Gastroenterology

## 2020-06-22 ENCOUNTER — Other Ambulatory Visit: Payer: Self-pay | Admitting: Gastroenterology

## 2020-06-22 ENCOUNTER — Other Ambulatory Visit: Payer: Self-pay

## 2020-06-22 DIAGNOSIS — K449 Diaphragmatic hernia without obstruction or gangrene: Secondary | ICD-10-CM | POA: Diagnosis not present

## 2020-07-26 ENCOUNTER — Encounter: Payer: Self-pay | Admitting: Vascular Surgery

## 2020-07-27 NOTE — Progress Notes (Signed)
[facility] Note  07/28/2020     CHIEF COMPLAINT Patient presents for Retina Follow Up   HISTORY OF PRESENT ILLNESS: Meredith Fernandez is a 69 y.o. female who presents to the clinic today for:   HPI    Retina Follow Up    Patient presents with  PVD.  In left eye.  Severity is moderate.  Duration of 9 months.  Since onset it is stable.  I, the attending physician,  performed the HPI with the patient and updated documentation appropriately.          Comments    Patient states vision the same OU.       Last edited by Bernarda Caffey, MD on 07/28/2020  9:38 AM. (History)    pt states vision is unchanged from last exam, she states she has recently seen Dr. Katy Fitch and he reported no change in vision, he did not recommend cataract sx at this time or give her new glasses, no new fol or floaters   Referring physician: Warden Fillers, [address] [place],  [place] [address]  HISTORICAL INFORMATION:   Selected notes from the MEDICAL RECORD NUMBER Referred by Dr. Midge Aver for concern of VH/PVD OS LEE: 03.03.20 (C. Groat) [BCVA: OD: 20/40 OS: 20/30]  Ocular Hx-cataracts OU PMH-MS, breast cancer    CURRENT MEDICATIONS: No current outpatient medications on file. (Ophthalmic Drugs)   No current facility-administered medications for this visit. (Ophthalmic Drugs)   Current Outpatient Medications (Other)  Medication Sig  . calcium citrate (CALCITRATE - DOSED IN MG ELEMENTAL CALCIUM) 950 (200 Ca) MG tablet Take 200 mg of elemental calcium by mouth daily.  . hydrochlorothiazide (HYDRODIURIL) 25 MG tablet Take 25 mg by mouth daily.  . Multiple Vitamins-Minerals (MULTIVITAMIN WITH MINERALS) tablet Take by mouth.  Marland Kitchen omeprazole (PRILOSEC) 40 MG capsule Take by mouth.  . pravastatin (PRAVACHOL) 10 MG tablet   . silver sulfADIAZINE (SILVADENE) 1 % cream Apply 1 application topically daily. (Patient not taking: Reported on 07/28/2020)  . tamoxifen  (NOLVADEX) 20 MG tablet Take by mouth. (Patient not taking: Reported on 07/28/2020)   No current facility-administered medications for this visit. (Other)      REVIEW OF SYSTEMS: ROS    Positive for: Eyes   Negative for: Constitutional, Gastrointestinal, Neurological, Skin, Genitourinary, Musculoskeletal, HENT, Endocrine, Cardiovascular, Respiratory, Psychiatric, Allergic/Imm, Heme/Lymph   Last edited by Roselee Nova D, COT on 07/28/2020  8:41 AM. (History)       ALLERGIES No Known Allergies  PAST MEDICAL HISTORY Past Medical History:  Diagnosis Date  . Acid reflux   . Cancer ([place])   . Cataract    OD  . Hypercholesteremia   . Vitamin D deficiency    Past Surgical History:  Procedure Laterality Date  . ABDOMINAL HYSTERECTOMY    . CESAREAN SECTION    . MASTECTOMY Bilateral 02/2014    FAMILY HISTORY Family History  Problem Relation Age of Onset  . Hypertension Mother   . Hyperlipidemia Mother   . Hypertension Sister   . Cancer Brother     SOCIAL HISTORY Social History   Tobacco Use  . Smoking status: Never Smoker  . Smokeless tobacco: Never Used  Substance Use Topics  . Alcohol use: Not on file  . Drug use: Not on file         OPHTHALMIC EXAM:  Base Eye Exam    Visual Acuity (Snellen - Linear)  Right Left   Dist cc 20/30 +2 20/20   Dist ph cc 20/20 -1    Correction: Glasses       Tonometry (Tonopen, 8:48 AM)      Right Left   Pressure 23 23       Pupils      Dark Light Shape React APD   Right 3 2 Round Brisk None   Left 3 2 Round Brisk None       Visual Fields (Counting fingers)      Left Right    Full Full       Extraocular Movement      Right Left    Full, Ortho Full, Ortho       Neuro/Psych    Oriented x3: Yes   Mood/Affect: Normal       Dilation    Both eyes: 1.0% Mydriacyl, 2.5% Phenylephrine @ 8:48 AM        Slit Lamp and Fundus Exam    Slit Lamp Exam      Right Left   Lids/Lashes Dermatochalasis - upper lid,  mild Meibomian gland dysfunction Dermatochalasis - upper lid   Conjunctiva/Sclera White and quiet Conjunctival Cyst at 0430; otherwise white and quiet   Cornea 1+ Punctate epithelial erosions, Arcus 1+ Punctate epithelial erosions   Anterior Chamber deep and clear deep and clear   Iris Round and dilated Round and dilated   Lens 2+ Nuclear sclerosis, 2+ Cortical cataract 2+ Nuclear sclerosis, 2+ Cortical cataract   Vitreous mild Vitreous syneresis mild Vitreous syneresis, Posterior vitreous detachment, Weiss ring settled inferiorly       Fundus Exam      Right Left   Disc mild tilt, temporal PPA, Pink and Sharp tilted disc, +PPA, Compact, Pink and Sharp   C/D Ratio 0.5 0.3   Macula Flat, blunted foveal reflex, mild RPE mottling and clumping, No heme or edema Flat, Blunted foveal reflex, Epiretinal membrane greatest superiorly, Retinal pigment epithelial mottling and clumping, No heme or edema   Vessels Vascular attenuation, Tortuous Vascular attenuation, Tortuous   Periphery Attached, mild cystoid degeneration and paving stones inferiorly Attached, pigmented cystoid degeneration inferiorly, No RT/RD on 360 exam        Refraction    Wearing Rx      Sphere Cylinder Axis Add   Right -9.00 +1.50 153 +2.25   Left -8.25 +0.75 039 +2.25   Type: PAL          IMAGING AND PROCEDURES  Imaging and Procedures for @TODAY @  OCT, Retina - OU - Both Eyes       Right Eye Quality was good. Central Foveal Thickness: 250. Progression has been stable. Findings include normal foveal contour, no SRF, no IRF (Partial PVD).   Left Eye Quality was good. Central Foveal Thickness: 256. Progression has been stable. Findings include no SRF, no IRF, epiretinal membrane, normal foveal contour, macular pucker (Mild interval thickening of ERM superiorly).   Notes *Images captured and stored on drive  Diagnosis / Impression:  NFP, no IRF/SRF OU ERM OS -- Mild interval thickening of ERM  superiorly   Clinical management:  See below  Abbreviations: NFP - Normal foveal profile. CME - cystoid macular edema. PED - pigment epithelial detachment. IRF - intraretinal fluid. SRF - subretinal fluid. EZ - ellipsoid zone. ERM - epiretinal membrane. ORA - outer retinal atrophy. ORT - outer retinal tubulation. SRHM - subretinal hyper-reflective material  ASSESSMENT/PLAN:    ICD-10-CM   1. Epiretinal membrane (ERM) of left eye  H35.372   2. Retinal edema  H35.81 OCT, Retina - OU - Both Eyes  3. Posterior vitreous detachment of left eye  H43.812   4. Combined forms of age-related cataract of both eyes  H25.813   5. Myopia of both eyes with astigmatism  H52.13    H52.203     1,2. Epiretinal membrane, OS  - mild ERM greatest superiorly -- stable  - asymptomatic, no metamorphopsia  - no indication for surgery at this time  - monitor for now  - f/u 1 year, DFE, OCT  3. PVD / vitreous syneresis OS  - onset of symptoms early March 2020-- symptoms stably improved  - No RT or RD on 360 scleral depressed exam  - Discussed findings and prognosis  - Reviewed s/s of RT/RD  - Strict return precautions for any such RT/RD signs/symptoms  - monitor   4. Mixed form age related cataract OU  - The symptoms of cataract, surgical options, and treatments and risks were discussed with patient.  - discussed diagnosis and progression  - not yet visually significant  - monitor for now  5. High myopia w/ astigmatism  - monitor   Ophthalmic Meds Ordered this visit:  No orders of the defined types were placed in this encounter.      Return in about 1 year (around 07/28/2021) for f/u ERM OS, DFE, OCT.  There are no Patient Instructions on file for this visit.   Explained the diagnoses, plan, and follow up with the patient and they expressed understanding.  Patient expressed understanding of the importance of proper follow up care.   This document serves as a record  of services personally performed by Gardiner Sleeper, MD, PhD. It was created on their behalf by Leonie Douglas, an ophthalmic technician. The creation of this record is the provider's dictation and/or activities during the visit.    Electronically signed by: Leonie Douglas COA, 07/28/20  12:12 PM   This document serves as a record of services personally performed by Gardiner Sleeper, MD, PhD. It was created on their behalf by San Jetty. Owens Shark, OA an ophthalmic technician. The creation of this record is the provider's dictation and/or activities during the visit.    Electronically signed by: San Jetty. Owens Shark, [place] 09.23.2021 12:12 PM   Gardiner Sleeper, M.D., Ph.D. Diseases & Surgery of the Retina and Vitreous [facility]  I have reviewed the above documentation for accuracy and completeness, and I agree with the above. Gardiner Sleeper, M.D., Ph.D. 07/28/20 12:12 PM   Abbreviations: M myopia (nearsighted); A astigmatism; H hyperopia (farsighted); P presbyopia; Mrx spectacle prescription;  CTL contact lenses; OD right eye; OS left eye; OU both eyes  XT exotropia; ET esotropia; PEK punctate epithelial keratitis; PEE punctate epithelial erosions; DES dry eye syndrome; MGD meibomian gland dysfunction; ATs artificial tears; PFAT's preservative free artificial tears; [place] nuclear sclerotic cataract; PSC posterior subcapsular cataract; ERM epi-retinal membrane; PVD posterior vitreous detachment; RD retinal detachment; DM diabetes mellitus; DR diabetic retinopathy; NPDR non-proliferative diabetic retinopathy; PDR proliferative diabetic retinopathy; CSME clinically significant macular edema; DME diabetic macular edema; dbh dot blot hemorrhages; CWS cotton wool spot; POAG primary open angle glaucoma; C/D cup-to-disc ratio; HVF humphrey visual field; GVF goldmann visual field; OCT optical coherence tomography; IOP intraocular pressure; BRVO Branch retinal vein occlusion; CRVO central retinal vein  occlusion; CRAO central retinal artery occlusion;  BRAO branch retinal artery occlusion; RT retinal tear; SB scleral buckle; PPV pars plana vitrectomy; VH Vitreous hemorrhage; PRP panretinal laser photocoagulation; IVK intravitreal kenalog; VMT vitreomacular traction; MH Macular hole;  NVD neovascularization of the disc; NVE neovascularization elsewhere; AREDS age related eye disease study; ARMD age related macular degeneration; POAG primary open angle glaucoma; EBMD epithelial/anterior basement membrane dystrophy; ACIOL anterior chamber intraocular lens; IOL intraocular lens; PCIOL posterior chamber intraocular lens; Phaco/IOL phacoemulsification with intraocular lens placement; [place] photorefractive keratectomy; LASIK laser assisted in situ keratomileusis; HTN hypertension; DM diabetes mellitus; COPD chronic obstructive pulmonary disease

## 2020-07-28 ENCOUNTER — Encounter (INDEPENDENT_AMBULATORY_CARE_PROVIDER_SITE_OTHER): Payer: Self-pay | Admitting: Ophthalmology

## 2020-07-28 ENCOUNTER — Ambulatory Visit (INDEPENDENT_AMBULATORY_CARE_PROVIDER_SITE_OTHER): Payer: Medicare HMO | Admitting: Ophthalmology

## 2020-07-28 ENCOUNTER — Other Ambulatory Visit: Payer: Self-pay

## 2020-07-28 DIAGNOSIS — H5213 Myopia, bilateral: Secondary | ICD-10-CM

## 2020-07-28 DIAGNOSIS — H35372 Puckering of macula, left eye: Secondary | ICD-10-CM

## 2020-07-28 DIAGNOSIS — H3581 Retinal edema: Secondary | ICD-10-CM

## 2020-07-28 DIAGNOSIS — H43812 Vitreous degeneration, left eye: Secondary | ICD-10-CM

## 2020-07-28 DIAGNOSIS — H25813 Combined forms of age-related cataract, bilateral: Secondary | ICD-10-CM | POA: Diagnosis not present

## 2020-07-28 DIAGNOSIS — H52203 Unspecified astigmatism, bilateral: Secondary | ICD-10-CM

## 2020-08-05 ENCOUNTER — Encounter (INDEPENDENT_AMBULATORY_CARE_PROVIDER_SITE_OTHER): Payer: Medicare HMO | Admitting: Ophthalmology

## 2020-08-05 ENCOUNTER — Other Ambulatory Visit: Payer: Self-pay

## 2020-08-05 DIAGNOSIS — H43813 Vitreous degeneration, bilateral: Secondary | ICD-10-CM

## 2020-08-05 DIAGNOSIS — H2513 Age-related nuclear cataract, bilateral: Secondary | ICD-10-CM | POA: Diagnosis not present

## 2020-08-05 DIAGNOSIS — H35372 Puckering of macula, left eye: Secondary | ICD-10-CM

## 2020-08-15 ENCOUNTER — Other Ambulatory Visit (HOSPITAL_COMMUNITY): Payer: Self-pay | Admitting: Physician Assistant

## 2020-08-15 DIAGNOSIS — I739 Peripheral vascular disease, unspecified: Secondary | ICD-10-CM

## 2020-08-16 ENCOUNTER — Encounter: Payer: Medicare HMO | Admitting: Vascular Surgery

## 2020-08-16 ENCOUNTER — Ambulatory Visit (HOSPITAL_COMMUNITY): Payer: Medicare HMO

## 2020-08-22 ENCOUNTER — Other Ambulatory Visit: Payer: Self-pay | Admitting: General Surgery

## 2020-08-22 DIAGNOSIS — K449 Diaphragmatic hernia without obstruction or gangrene: Secondary | ICD-10-CM

## 2020-09-13 ENCOUNTER — Encounter (HOSPITAL_COMMUNITY)
Admission: RE | Admit: 2020-09-13 | Discharge: 2020-09-13 | Disposition: A | Discharge status: Home / Self Care | Payer: Medicare HMO | Source: Ambulatory Visit | Attending: General Surgery | Admitting: General Surgery

## 2020-09-13 ENCOUNTER — Other Ambulatory Visit: Payer: Self-pay

## 2020-09-13 DIAGNOSIS — K449 Diaphragmatic hernia without obstruction or gangrene: Secondary | ICD-10-CM | POA: Insufficient documentation

## 2020-09-13 MED ORDER — TECHNETIUM TC 99M SULFUR COLLOID
2.2000 | Freq: Once | INTRAVENOUS | Status: AC | PRN
  Administered 2020-09-13: 2.2 via INTRAVENOUS

## 2020-09-15 ENCOUNTER — Other Ambulatory Visit: Payer: Self-pay

## 2020-09-15 ENCOUNTER — Ambulatory Visit (HOSPITAL_COMMUNITY)
Admission: RE | Admit: 2020-09-15 | Discharge: 2020-09-15 | Disposition: A | Discharge status: Home / Self Care | Payer: Medicare HMO | Source: Ambulatory Visit | Attending: General Surgery | Admitting: General Surgery

## 2020-09-15 DIAGNOSIS — K449 Diaphragmatic hernia without obstruction or gangrene: Secondary | ICD-10-CM | POA: Insufficient documentation

## 2020-09-26 ENCOUNTER — Ambulatory Visit: Payer: Self-pay | Admitting: General Surgery

## 2020-09-26 NOTE — H&P (Signed)
History of Present Illness Ralene Ok MD; 09/26/2020 4:06 PM) The patient is a 69 year old female who presents with a hiatal hernia. Patient is a 69 year old female who follows back today after radiological studies. Patient's been doing well from symptomatic standpoint. Patient's studies to include her gastric emptying study-normal, upper GI-which showed a large hiatal hernia, and CT scan which showed a 3.4 cm hiatal hernia with approximately two thirds of her stomach in her chest cavity, were reviewed with the patient. These personally.  Patient states that she is not having any further issues with retching or chest pain. I do feel that the hernia is likely the cause of her retching, undigested food in nausea vomiting couple chest pain.  ---------------------------------------------------------------- Patient is a 69 year old female who is referred by Dr. Alan Ripper for evaluation of a hiatal hernia. Patient states that she's had multiple episodes of nausea vomiting, retching with most recent being a couple days ago. Patient states that minimally she vomits undigested food. States that can be a couple days of food that she vomits. Patient underwent esophagogram which revealed a large hiatal hernia within the chest cavity. I did review this personally. Patient states that she's had no issues with reflux. She did undergo an endoscopy I do not have the report currently but she states that did show some signs of reflux, no ulcers or erosions.  Patient had a previous C-section in the past.  Patient has not had any manometry or gastric emptying stud, or CT scan y at this point.    Allergies (Chanel Teressa Senter, CMA; 09/26/2020 3:48 PM) No Known Drug Allergies  [08/17/2020]: Allergies Reconciled   Medication History (Chanel Teressa Senter, CMA; 09/26/2020 3:48 PM) Pravastatin Sodium (10MG  Tablet, Oral) Active. hydroCHLOROthiazide (50MG  Tablet, Oral) Active. Medications Reconciled    Review of  Systems Ralene Ok, MD; 09/26/2020 4:7 PM) General Not Present- Appetite Loss, Chills, Fatigue, Fever, Night Sweats, Weight Gain and Weight Loss. Skin Not Present- Change in Wart/Mole, Dryness, Hives, Jaundice, New Lesions, Non-Healing Wounds, Rash and Ulcer. HEENT Present- Seasonal Allergies and Wears glasses/contact lenses. Not Present- Earache, Hearing Loss, Hoarseness, Nose Bleed, Oral Ulcers, Ringing in the Ears, Sinus Pain, Sore Throat, Visual Disturbances and Yellow Eyes. Respiratory Not Present- Bloody sputum, Chronic Cough, Difficulty Breathing, Snoring and Wheezing. Breast Not Present- Breast Mass, Breast Pain, Nipple Discharge and Skin Changes. Cardiovascular Present- Swelling of Extremities. Not Present- Chest Pain, Difficulty Breathing Lying Down, Leg Cramps, Palpitations, Rapid Heart Rate and Shortness of Breath. Gastrointestinal Not Present- Abdominal Pain, Bloating, Bloody Stool, Change in Bowel Habits, Chronic diarrhea, Constipation, Difficulty Swallowing, Excessive gas, Gets full quickly at meals, Hemorrhoids, Indigestion, Nausea, Rectal Pain and Vomiting. Female Genitourinary Not Present- Frequency, Nocturia, Painful Urination, Pelvic Pain and Urgency. Musculoskeletal Present- Swelling of Extremities. Not Present- Back Pain, Joint Pain, Joint Stiffness, Muscle Pain and Muscle Weakness. Neurological Not Present- Decreased Memory, Fainting, Headaches, Numbness, Seizures, Tingling, Tremor, Trouble walking and Weakness. Psychiatric Not Present- Anxiety, Bipolar, Change in Sleep Pattern, Depression, Fearful and Frequent crying. Endocrine Not Present- Cold Intolerance, Excessive Hunger, Hair Changes, Heat Intolerance, Hot flashes and New Diabetes. Hematology Not Present- Blood Thinners, Easy Bruising, Excessive bleeding, Gland problems, HIV and Persistent Infections. All other systems negative  Vitals (Chanel Nolan CMA; 09/26/2020 3:48 PM) 09/26/2020 3:48 PM Weight: 261.38 lb  Height: 62in Body Surface Area: 2.14 m Body Mass Index: 47.81 kg/m  Temp.: 97.95F  Pulse: 121 (Regular)  BP: 130/82(Sitting, Left Arm, Standard)       Physical Exam Ralene Ok, MD;  09/26/2020 4:7 PM) The physical exam findings are as follows: Note: Constitutional: No acute distress, conversant, appears stated age  Eyes: Anicteric sclerae, moist conjunctiva, no lid lag  Neck: No thyromegaly, trachea midline, no cervical lymphadenopathy  Lungs: Clear to auscultation biilaterally, normal respiratory effot  Cardiovascular: regular rate & rhythm, no murmurs, no peripheal edema, pedal pulses 2+  GI: Soft, no masses or hepatosplenomegaly, non-tender to palpation  MSK: Normal gait, no clubbing cyanosis, edema  Skin: No rashes, palpation reveals normal skin turgor  Psychiatric: Appropriate judgment and insight, oriented to person, place, and time    Assessment & Plan Ralene Ok MD; 09/26/2020 4:07 PM) HIATAL HERNIA (K44.9) Impression: 69 year old female with a large hiatal hernia. 1. Will proceed to the operating room for a robotic hiatal hernia repair with mesh and fundoplication  2. Discussed with patient the risks and benefits of the procedure to include but not limited to: Infection, bleeding, damage to structures, possible pneumothorax, possible recurrence. The patient voiced understanding and wishes to proceed.

## 2020-10-17 ENCOUNTER — Other Ambulatory Visit: Payer: Self-pay

## 2020-10-17 DIAGNOSIS — K449 Diaphragmatic hernia without obstruction or gangrene: Secondary | ICD-10-CM

## 2020-10-17 DIAGNOSIS — Z1159 Encounter for screening for other viral diseases: Secondary | ICD-10-CM

## 2020-10-19 ENCOUNTER — Telehealth: Payer: Self-pay

## 2020-10-19 NOTE — Telephone Encounter (Signed)
Referral for esophageal manometry received. Provider Dr Ralene Ok, MD Practice name [facility] [phone] Fax [phone] Instructions for procedure mailed.

## 2020-11-07 ENCOUNTER — Other Ambulatory Visit (HOSPITAL_COMMUNITY)
Admission: RE | Admit: 2020-11-07 | Discharge: 2020-11-07 | Disposition: A | Discharge status: Home / Self Care | Payer: Medicare HMO | Source: Ambulatory Visit | Attending: Gastroenterology | Admitting: Gastroenterology

## 2020-11-07 DIAGNOSIS — Z01812 Encounter for preprocedural laboratory examination: Secondary | ICD-10-CM | POA: Diagnosis present

## 2020-11-07 DIAGNOSIS — Z20822 Contact with and (suspected) exposure to covid-19: Secondary | ICD-10-CM | POA: Diagnosis not present

## 2020-11-07 LAB — SARS CORONAVIRUS 2 (TAT 6-24 HRS): SARS Coronavirus 2: NEGATIVE

## 2020-11-09 ENCOUNTER — Ambulatory Visit (HOSPITAL_COMMUNITY)
Admission: RE | Admit: 2020-11-09 | Discharge: 2020-11-09 | Disposition: A | Discharge status: Home / Self Care | Payer: Medicare HMO | Attending: Gastroenterology | Admitting: Gastroenterology

## 2020-11-09 ENCOUNTER — Encounter (HOSPITAL_COMMUNITY)
Admission: RE | Disposition: A | Discharge status: Home / Self Care | Payer: Self-pay | Source: Home / Self Care | Attending: Gastroenterology

## 2020-11-09 DIAGNOSIS — K449 Diaphragmatic hernia without obstruction or gangrene: Secondary | ICD-10-CM

## 2020-11-09 DIAGNOSIS — K219 Gastro-esophageal reflux disease without esophagitis: Secondary | ICD-10-CM | POA: Diagnosis not present

## 2020-11-09 DIAGNOSIS — Z01818 Encounter for other preprocedural examination: Secondary | ICD-10-CM | POA: Diagnosis not present

## 2020-11-09 HISTORY — PX: ESOPHAGEAL MANOMETRY: SHX5429

## 2020-11-09 SURGERY — MANOMETRY, ESOPHAGUS
Anesthesia: Choice

## 2020-11-09 SURGICAL SUPPLY — 2 items
FACESHIELD LNG OPTICON STERILE (SAFETY) IMPLANT
GLOVE BIO SURGEON STRL SZ8 (GLOVE) ×4 IMPLANT

## 2020-11-09 NOTE — Progress Notes (Signed)
Esophageal Manometry done per protocol. Pt tolerated well without distress or complication.  

## 2020-11-10 DIAGNOSIS — K219 Gastro-esophageal reflux disease without esophagitis: Secondary | ICD-10-CM

## 2020-11-10 DIAGNOSIS — Z01818 Encounter for other preprocedural examination: Secondary | ICD-10-CM

## 2020-11-10 DIAGNOSIS — K449 Diaphragmatic hernia without obstruction or gangrene: Secondary | ICD-10-CM

## 2020-11-11 ENCOUNTER — Encounter (HOSPITAL_COMMUNITY): Payer: Self-pay | Admitting: Gastroenterology

## 2020-11-16 ENCOUNTER — Other Ambulatory Visit (HOSPITAL_COMMUNITY): Payer: Self-pay | Admitting: Internal Medicine

## 2020-11-16 ENCOUNTER — Ambulatory Visit: Payer: Medicare HMO | Attending: Internal Medicine

## 2020-11-16 DIAGNOSIS — Z23 Encounter for immunization: Secondary | ICD-10-CM

## 2020-11-16 NOTE — Progress Notes (Signed)
   Covid-19 Vaccination Clinic  Name:  Meredith Fernandez    MRN: [id number] DOB: 06/14/1951  11/16/2020  Meredith Fernandez was observed post Covid-19 immunization for 15 minutes without incident. She was provided with Vaccine Information Sheet and instruction to access the V-Safe system.   Meredith Fernandez was instructed to call 911 with any severe reactions post vaccine: Marland Kitchen Difficulty breathing  . Swelling of face and throat  . A fast heartbeat  . A bad rash all over body  . Dizziness and weakness   Immunizations Administered    Name Date Dose VIS Date Route   Pfizer COVID-19 Vaccine 11/16/2020 11:01 AM 0.3 mL 08/24/2020 Intramuscular   Manufacturer: [place]   Lot: [id number]   NDC: [address]-0

## 2020-11-21 NOTE — Progress Notes (Signed)
Your procedure is scheduled on Nov 29, 2020 Tuesday.  Report to [facility] Main Entrance "A" at 05:30 A.M., and check in at the Admitting office.  Call this number if you have problems the morning of surgery: [phone]  Call [phone] if you have any questions prior to your surgery date Monday-Friday 8am-4pm   Remember: Do not eat after midnight the night before your surgery  You may drink clear liquids until 04:30 A.M. the morning of your surgery.   Clear liquids allowed are: Water, Non-Citrus Juices (without pulp), Carbonated Beverages, Clear Tea, Black Coffee Only, and Gatorade    Please complete 2 PRE-SURGERY ENSURE that was provided to you before midnight the night before your surgery and  1 PRE-SURGERY ENSURES by 04:30 A.M. the morning of your surgery.  Please, if able, drink it in one setting. DO NOT SIP.  Take these medicines the morning of surgery with A SIP OF WATER: omeprazole (PRILOSEC)  pravastatin (PRAVACHOL)   As of today, STOP taking any Aspirin (unless otherwise instructed by your surgeon), Aleve, Naproxen, Ibuprofen, Motrin, Advil, Goody's, BC's, all herbal medications, fish oil, and all vitamins.    The Morning of Surgery  Do not wear jewelry, make-up or nail polish.  Do not wear lotions, powders, or perfumes, or deodorant  Do not shave 48 hours prior to surgery.    Do not bring valuables to the hospital.  [facility] is not responsible for any belongings or valuables.  If you are a smoker, DO NOT Smoke 24 hours prior to surgery  If you wear a CPAP at night please bring your mask the morning of surgery   Remember that you must have someone to transport you home after your surgery, and remain with you for 24 hours if you are discharged the same day.   Please bring cases for contacts, glasses, hearing aids, dentures or bridgework because it cannot be worn into surgery.    Leave your suitcase in the car.  After surgery it may be brought to your  room.  For patients admitted to the hospital, discharge time will be determined by your treatment team.  Patients discharged the day of surgery will not be allowed to drive home.    Special instructions:   [facility]- Preparing For Surgery  Before surgery, you can play an important role. Because skin is not sterile, your skin needs to be as free of germs as possible. You can reduce the number of germs on your skin by washing with CHG (chlorahexidine gluconate) Soap before surgery.  CHG is an antiseptic cleaner which kills germs and bonds with the skin to continue killing germs even after washing.    Oral Hygiene is also important to reduce your risk of infection.  Remember - BRUSH YOUR TEETH THE MORNING OF SURGERY WITH YOUR REGULAR TOOTHPASTE  Please do not use if you have an allergy to CHG or antibacterial soaps. If your skin becomes reddened/irritated stop using the CHG.  Do not shave (including legs and underarms) for at least 48 hours prior to first CHG shower. It is OK to shave your face.  Please follow these instructions carefully.   1. Shower the NIGHT BEFORE SURGERY and the MORNING OF SURGERY with CHG Soap.   2. If you chose to wash your hair and body, wash as usual with your normal shampoo and body-wash/soap.  3. Rinse your hair and body thoroughly to remove the shampoo and soap.  4. Apply CHG directly to the skin (  ONLY FROM THE NECK DOWN) and wash gently with a scrungie or a clean washcloth.   5. Do not use on open wounds or open sores. Avoid contact with your eyes, ears, mouth and genitals (private parts). Wash Face and genitals (private parts)  with your normal soap.   6. Wash thoroughly, paying special attention to the area where your surgery will be performed.  7. Thoroughly rinse your body with warm water from the neck down.  8. DO NOT shower/wash with your normal soap after using and rinsing off the CHG Soap.  9. Pat yourself dry with a CLEAN TOWEL.  10. Wear  CLEAN PAJAMAS to bed the night before surgery  11. Place CLEAN SHEETS on your bed the night of your first shower and DO NOT SLEEP WITH PETS.  12. Wear comfortable clothes the morning of surgery.     Day of Surgery:  Please shower the morning of surgery with the CHG soap Do not apply any deodorants/lotions. Please wear clean clothes to the hospital/surgery center.   Remember to brush your teeth WITH YOUR REGULAR TOOTHPASTE.   Please read over the following fact sheets that you were given.

## 2020-11-22 ENCOUNTER — Inpatient Hospital Stay (HOSPITAL_COMMUNITY)
Admission: RE | Admit: 2020-11-22 | Discharge: 2020-11-22 | Disposition: A | Discharge status: Home / Self Care | Payer: Medicare HMO | Source: Ambulatory Visit

## 2020-11-23 ENCOUNTER — Other Ambulatory Visit (HOSPITAL_COMMUNITY): Payer: Medicare HMO

## 2020-11-25 ENCOUNTER — Other Ambulatory Visit (HOSPITAL_COMMUNITY): Payer: Medicare HMO

## 2020-12-21 NOTE — Progress Notes (Signed)
Surgical Instructions    Your procedure is scheduled on 12/27/20.  Report to [facility] Main Entrance "A" at 05:30 A.M., then check in with the Admitting office.  Call this number if you have problems the morning of surgery:  [phone]   If you have any questions prior to your surgery date call [phone]: Open Monday-Friday 8am-4pm    Remember:  Do not eat after midnight the night before your surgery  You may drink clear liquids until 04:30am the morning of your surgery.   Clear liquids allowed are: Water, Non-Citrus Juices (without pulp), Carbonated Beverages, Clear Tea, Black Coffee Only, and Gatorade  Patient Instructions  . The night before surgery:  o No food after midnight. ONLY clear liquids after midnight  . The day of surgery (if you do NOT have diabetes):  o Drink ONE (1) Pre-Surgery Clear Ensure by 4:30am. Drink in one sitting. Do not sip. o This drink was given to you during your hospital  pre-op appointment visit. o Nothing else to drink after completing the  Pre-Surgery Clear Ensure.         If you have questions, please contact your surgeon's office.     Take these medicines the morning of surgery with A SIP OF WATER  acetaminophen (TYLENOL)  famotidine (PEPCID) pravastatin (PRAVACHOL)   As of today, STOP taking any Aspirin (unless otherwise instructed by your surgeon) Aleve, Naproxen, Ibuprofen, Motrin, Advil, Goody's, BC's, all herbal medications, fish oil, and all vitamins.                     Do not wear jewelry, make up, or nail polish            Do not wear lotions, powders, perfumes/colognes, or deodorant.            Do not shave 48 hours prior to surgery.  .            Do not bring valuables to the hospital.            [facility] is not responsible for any belongings or valuables.  Do NOT Smoke (Tobacco/Vaping) or drink Alcohol 24 hours prior to your procedure If you use a CPAP at night, you may bring all equipment for your overnight stay.    Contacts, glasses, dentures or bridgework may not be worn into surgery, please bring cases for these belongings   For patients admitted to the hospital, discharge time will be determined by your treatment team.   Patients discharged the day of surgery will not be allowed to drive home, and someone needs to stay with them for 24 hours.    Special instructions:   [place]- Preparing For Surgery  Before surgery, you can play an important role. Because skin is not sterile, your skin needs to be as free of germs as possible. You can reduce the number of germs on your skin by washing with CHG (chlorahexidine gluconate) Soap before surgery.  CHG is an antiseptic cleaner which kills germs and bonds with the skin to continue killing germs even after washing.    Oral Hygiene is also important to reduce your risk of infection.  Remember - BRUSH YOUR TEETH THE MORNING OF SURGERY WITH YOUR REGULAR TOOTHPASTE  Please do not use if you have an allergy to CHG or antibacterial soaps. If your skin becomes reddened/irritated stop using the CHG.  Do not shave (including legs and underarms) for at least 48 hours prior to first CHG  shower. It is OK to shave your face.  Please follow these instructions carefully.   1. Shower the NIGHT BEFORE SURGERY and the MORNING OF SURGERY  2. If you chose to wash your hair, wash your hair first as usual with your normal shampoo.  3. After you shampoo, rinse your hair and body thoroughly to remove the shampoo.  4. Wash Face and genitals (private parts) with your normal soap.   5.  Shower the NIGHT BEFORE SURGERY and the MORNING OF SURGERY with CHG Soap.   6. Use CHG Soap as you would any other liquid soap. You can apply CHG directly to the skin and wash gently with a scrungie or a clean washcloth.   7. Apply the CHG Soap to your body ONLY FROM THE NECK DOWN.  Do not use on open wounds or open sores. Avoid contact with your eyes, ears, mouth and genitals (private  parts). Wash Face and genitals (private parts)  with your normal soap.   8. Wash thoroughly, paying special attention to the area where your surgery will be performed.  9. Thoroughly rinse your body with warm water from the neck down.  10. DO NOT shower/wash with your normal soap after using and rinsing off the CHG Soap.  11. Pat yourself dry with a CLEAN TOWEL.  12. Wear CLEAN PAJAMAS to bed the night before surgery  13. Place CLEAN SHEETS on your bed the night before your surgery  14. DO NOT SLEEP WITH PETS.   Day of Surgery: Take a shower.  Wear Clean/Comfortable clothing the morning of surgery Do not apply any deodorants/lotions.   Remember to brush your teeth WITH YOUR REGULAR TOOTHPASTE.   Please read over the following fact sheets that you were given.

## 2020-12-22 ENCOUNTER — Encounter (HOSPITAL_COMMUNITY): Payer: Self-pay

## 2020-12-22 ENCOUNTER — Encounter (HOSPITAL_COMMUNITY)
Admission: RE | Admit: 2020-12-22 | Discharge: 2020-12-22 | Disposition: A | Discharge status: Home / Self Care | Payer: Medicare HMO | Source: Ambulatory Visit | Attending: General Surgery | Admitting: General Surgery

## 2020-12-22 ENCOUNTER — Other Ambulatory Visit: Payer: Self-pay

## 2020-12-22 DIAGNOSIS — Z01812 Encounter for preprocedural laboratory examination: Secondary | ICD-10-CM | POA: Insufficient documentation

## 2020-12-22 HISTORY — DX: Pneumonia, unspecified organism: J18.9

## 2020-12-22 LAB — CBC
HCT: 41.8 % (ref 36.0–46.0)
Hemoglobin: 14.6 g/dL (ref 12.0–15.0)
MCH: 30.5 pg (ref 26.0–34.0)
MCHC: 34.9 g/dL (ref 30.0–36.0)
MCV: 87.3 fL (ref 80.0–100.0)
Platelets: 287 10*3/uL (ref 150–400)
RBC: 4.79 MIL/uL (ref 3.87–5.11)
RDW: 13.1 % (ref 11.5–15.5)
WBC: 6.9 10*3/uL (ref 4.0–10.5)
nRBC: 0 % (ref 0.0–0.2)

## 2020-12-22 LAB — BASIC METABOLIC PANEL
Anion gap: 12 (ref 5–15)
BUN: 13 mg/dL (ref 8–23)
CO2: 28 mmol/L (ref 22–32)
Calcium: 9.2 mg/dL (ref 8.9–10.3)
Chloride: 98 mmol/L (ref 98–111)
Creatinine, Ser: 0.98 mg/dL (ref 0.44–1.00)
GFR, Estimated: >60 mL/min (ref >60)
Glucose, Bld: 89 mg/dL (ref 70–99)
Potassium: 3 mmol/L — ABNORMAL LOW (ref 3.5–5.1)
Sodium: 138 mmol/L (ref 135–145)

## 2020-12-22 NOTE — Progress Notes (Signed)
PCP - Dr. Vira Browns Cardiologist - Had a cardiologist consult to review [place] of legs  Chest x-ray - Not indicated EKG - Not indicated Stress Test - Denies ECHO - Denies Cardiac Cath - Denies  Sleep Study - Denies OSA  DM - Denies  ERAS Protcol -Yes PRE-SURGERY Ensure given   COVID TEST- 12/24/20   Anesthesia review: No  Patient denies shortness of breath, fever, cough and chest pain at PAT appointment   All instructions explained to the patient, with a verbal understanding of the material. Patient agrees to go over the instructions while at home for a better understanding. Patient also instructed to self quarantine after being tested for COVID-19. The opportunity to ask questions was provided.

## 2020-12-24 ENCOUNTER — Other Ambulatory Visit (HOSPITAL_COMMUNITY)
Admission: RE | Admit: 2020-12-24 | Discharge: 2020-12-24 | Disposition: A | Discharge status: Home / Self Care | Payer: Medicare HMO | Source: Ambulatory Visit | Attending: General Surgery | Admitting: General Surgery

## 2020-12-24 DIAGNOSIS — Z01812 Encounter for preprocedural laboratory examination: Secondary | ICD-10-CM | POA: Insufficient documentation

## 2020-12-24 DIAGNOSIS — Z20822 Contact with and (suspected) exposure to covid-19: Secondary | ICD-10-CM | POA: Insufficient documentation

## 2020-12-24 LAB — SARS CORONAVIRUS 2 (TAT 6-24 HRS): SARS Coronavirus 2: NEGATIVE

## 2020-12-26 MED ORDER — DEXTROSE 5 % IV SOLN
3.0000 g | INTRAVENOUS | Status: AC
  Administered 2020-12-27: 3 g via INTRAVENOUS
  Filled 2020-12-26: qty 3

## 2020-12-27 ENCOUNTER — Inpatient Hospital Stay (HOSPITAL_COMMUNITY)
Admission: RE | Admit: 2020-12-27 | Discharge: 2020-12-28 | DRG: 327 | Disposition: A | Discharge status: Home / Self Care | Payer: Medicare HMO | Attending: General Surgery | Admitting: General Surgery

## 2020-12-27 ENCOUNTER — Inpatient Hospital Stay (HOSPITAL_COMMUNITY): Payer: Medicare HMO | Admitting: Anesthesiology

## 2020-12-27 ENCOUNTER — Other Ambulatory Visit: Payer: Self-pay

## 2020-12-27 ENCOUNTER — Encounter (HOSPITAL_COMMUNITY): Payer: Self-pay | Admitting: General Surgery

## 2020-12-27 ENCOUNTER — Encounter (HOSPITAL_COMMUNITY)
Admission: RE | Disposition: A | Discharge status: Home / Self Care | Payer: Self-pay | Source: Home / Self Care | Attending: General Surgery

## 2020-12-27 DIAGNOSIS — K219 Gastro-esophageal reflux disease without esophagitis: Secondary | ICD-10-CM | POA: Diagnosis present

## 2020-12-27 DIAGNOSIS — Z6841 Body Mass Index (BMI) 40.0 and over, adult: Secondary | ICD-10-CM

## 2020-12-27 DIAGNOSIS — G35 Multiple sclerosis: Secondary | ICD-10-CM | POA: Diagnosis present

## 2020-12-27 DIAGNOSIS — Z79899 Other long term (current) drug therapy: Secondary | ICD-10-CM

## 2020-12-27 DIAGNOSIS — Z20822 Contact with and (suspected) exposure to covid-19: Secondary | ICD-10-CM | POA: Diagnosis present

## 2020-12-27 DIAGNOSIS — K449 Diaphragmatic hernia without obstruction or gangrene: Principal | ICD-10-CM | POA: Diagnosis present

## 2020-12-27 DIAGNOSIS — Z9889 Other specified postprocedural states: Secondary | ICD-10-CM | POA: Diagnosis present

## 2020-12-27 HISTORY — PX: INSERTION OF MESH: SHX5868

## 2020-12-27 HISTORY — PX: XI ROBOTIC ASSISTED HIATAL HERNIA REPAIR: SHX6889

## 2020-12-27 SURGERY — REPAIR, HERNIA, HIATAL, ROBOT-ASSISTED
Anesthesia: General | Site: Abdomen

## 2020-12-27 MED ORDER — DEXTROSE-NACL 5-0.9 % IV SOLN: INTRAVENOUS | Status: DC

## 2020-12-27 MED ORDER — PROPOFOL 10 MG/ML IV BOLUS
INTRAVENOUS | Status: AC
  Filled 2020-12-27: qty 20

## 2020-12-27 MED ORDER — ARTIFICIAL TEARS OPHTHALMIC OINT
TOPICAL_OINTMENT | OPHTHALMIC | Status: AC
  Filled 2020-12-27: qty 3.5

## 2020-12-27 MED ORDER — SODIUM CHLORIDE 0.9% FLUSH
INTRAVENOUS | Status: DC | PRN
  Administered 2020-12-27: 20 mL

## 2020-12-27 MED ORDER — ENSURE PRE-SURGERY PO LIQD: 296.0000 mL | Freq: Once | ORAL | Status: DC

## 2020-12-27 MED ORDER — BUPIVACAINE-EPINEPHRINE 0.25% -1:200000 IJ SOLN
INTRAMUSCULAR | Status: DC | PRN
  Administered 2020-12-27: 14 mL

## 2020-12-27 MED ORDER — ENSURE PRE-SURGERY PO LIQD: 592.0000 mL | Freq: Once | ORAL | Status: DC

## 2020-12-27 MED ORDER — ONDANSETRON HCL 4 MG/2ML IJ SOLN
INTRAMUSCULAR | Status: AC
  Filled 2020-12-27: qty 2

## 2020-12-27 MED ORDER — MIDAZOLAM HCL 2 MG/2ML IJ SOLN
INTRAMUSCULAR | Status: DC | PRN
  Administered 2020-12-27: 2 mg via INTRAVENOUS

## 2020-12-27 MED ORDER — ONDANSETRON HCL 4 MG/2ML IJ SOLN: 4.0000 mg | Freq: Four times a day (QID) | INTRAMUSCULAR | Status: DC | PRN

## 2020-12-27 MED ORDER — CHLORHEXIDINE GLUCONATE CLOTH 2 % EX PADS: 6.0000 | MEDICATED_PAD | Freq: Once | CUTANEOUS | Status: DC

## 2020-12-27 MED ORDER — FENTANYL CITRATE (PF) 100 MCG/2ML IJ SOLN: 25.0000 ug | INTRAMUSCULAR | Status: DC | PRN

## 2020-12-27 MED ORDER — LIDOCAINE 2% (20 MG/ML) 5 ML SYRINGE
INTRAMUSCULAR | Status: AC
  Filled 2020-12-27: qty 5

## 2020-12-27 MED ORDER — ONDANSETRON 4 MG PO TBDP: 4.0000 mg | ORAL_TABLET | Freq: Four times a day (QID) | ORAL | Status: DC | PRN

## 2020-12-27 MED ORDER — ACETAMINOPHEN 10 MG/ML IV SOLN
1000.0000 mg | Freq: Four times a day (QID) | INTRAVENOUS | Status: DC
  Administered 2020-12-27 – 2020-12-28 (×3): 1000 mg via INTRAVENOUS
  Filled 2020-12-27 (×3): qty 100

## 2020-12-27 MED ORDER — ROCURONIUM BROMIDE 10 MG/ML (PF) SYRINGE
PREFILLED_SYRINGE | INTRAVENOUS | Status: AC
  Filled 2020-12-27: qty 10

## 2020-12-27 MED ORDER — MIDAZOLAM HCL 2 MG/2ML IJ SOLN
INTRAMUSCULAR | Status: AC
  Filled 2020-12-27: qty 2

## 2020-12-27 MED ORDER — DEXAMETHASONE SODIUM PHOSPHATE 10 MG/ML IJ SOLN
INTRAMUSCULAR | Status: DC | PRN
  Administered 2020-12-27: 10 mg via INTRAVENOUS

## 2020-12-27 MED ORDER — FENTANYL CITRATE (PF) 250 MCG/5ML IJ SOLN
INTRAMUSCULAR | Status: DC | PRN
  Administered 2020-12-27: 50 ug via INTRAVENOUS
  Administered 2020-12-27: 100 ug via INTRAVENOUS

## 2020-12-27 MED ORDER — ONDANSETRON HCL 4 MG/2ML IJ SOLN
INTRAMUSCULAR | Status: DC | PRN
  Administered 2020-12-27: 4 mg via INTRAVENOUS

## 2020-12-27 MED ORDER — ROCURONIUM BROMIDE 10 MG/ML (PF) SYRINGE
PREFILLED_SYRINGE | INTRAVENOUS | Status: DC | PRN
  Administered 2020-12-27: 20 mg via INTRAVENOUS
  Administered 2020-12-27: 50 mg via INTRAVENOUS
  Administered 2020-12-27: 20 mg via INTRAVENOUS

## 2020-12-27 MED ORDER — DEXAMETHASONE SODIUM PHOSPHATE 10 MG/ML IJ SOLN
INTRAMUSCULAR | Status: AC
  Filled 2020-12-27: qty 1

## 2020-12-27 MED ORDER — 0.9 % SODIUM CHLORIDE (POUR BTL) OPTIME
TOPICAL | Status: DC | PRN
  Administered 2020-12-27: 1000 mL

## 2020-12-27 MED ORDER — ACETAMINOPHEN 500 MG PO TABS
ORAL_TABLET | ORAL | Status: AC
  Administered 2020-12-27: 1000 mg via ORAL
  Filled 2020-12-27: qty 2

## 2020-12-27 MED ORDER — SUGAMMADEX SODIUM 200 MG/2ML IV SOLN
INTRAVENOUS | Status: DC | PRN
  Administered 2020-12-27: 200 mg via INTRAVENOUS

## 2020-12-27 MED ORDER — ORAL CARE MOUTH RINSE: 15.0000 mL | Freq: Once | OROMUCOSAL | Status: AC

## 2020-12-27 MED ORDER — OXYCODONE HCL 5 MG/5ML PO SOLN
5.0000 mg | Freq: Once | ORAL | Status: DC | PRN
Start: 2020-12-27 — End: 2020-12-27

## 2020-12-27 MED ORDER — BUPIVACAINE LIPOSOME 1.3 % IJ SUSP
INTRAMUSCULAR | Status: DC | PRN
  Administered 2020-12-27: 20 mL

## 2020-12-27 MED ORDER — STERILE WATER FOR IRRIGATION IR SOLN
Status: DC | PRN
  Administered 2020-12-27: 1000 mL

## 2020-12-27 MED ORDER — FENTANYL CITRATE (PF) 250 MCG/5ML IJ SOLN
INTRAMUSCULAR | Status: AC
  Filled 2020-12-27: qty 5

## 2020-12-27 MED ORDER — ACETAMINOPHEN 500 MG PO TABS: 1000.0000 mg | ORAL_TABLET | ORAL | Status: AC

## 2020-12-27 MED ORDER — CHLORHEXIDINE GLUCONATE 0.12 % MT SOLN: 15.0000 mL | Freq: Once | OROMUCOSAL | Status: AC

## 2020-12-27 MED ORDER — OXYCODONE HCL 5 MG PO TABS: 5.0000 mg | ORAL_TABLET | Freq: Once | ORAL | Status: DC | PRN

## 2020-12-27 MED ORDER — LACTATED RINGERS IV SOLN: INTRAVENOUS | Status: DC

## 2020-12-27 MED ORDER — LIDOCAINE 2% (20 MG/ML) 5 ML SYRINGE
INTRAMUSCULAR | Status: DC | PRN
  Administered 2020-12-27: 50 mg via INTRAVENOUS

## 2020-12-27 MED ORDER — CHLORHEXIDINE GLUCONATE 0.12 % MT SOLN
OROMUCOSAL | Status: AC
  Administered 2020-12-27: 15 mL via OROMUCOSAL
  Filled 2020-12-27: qty 15

## 2020-12-27 MED ORDER — PHENYLEPHRINE HCL-NACL 10-0.9 MG/250ML-% IV SOLN
INTRAVENOUS | Status: DC | PRN
  Administered 2020-12-27: 25 ug/min via INTRAVENOUS

## 2020-12-27 MED ORDER — PROPOFOL 10 MG/ML IV BOLUS
INTRAVENOUS | Status: DC | PRN
  Administered 2020-12-27: 150 mg via INTRAVENOUS
  Administered 2020-12-27: 50 mg via INTRAVENOUS

## 2020-12-27 SURGICAL SUPPLY — 58 items
ADH SKN CLS APL DERMABOND .7 (GAUZE/BANDAGES/DRESSINGS) ×1
APL PRP STRL LF DISP 70% ISPRP (MISCELLANEOUS) ×1
APPLIER CLIP 5 13 M/L LIGAMAX5 (MISCELLANEOUS)
APR CLP MED LRG 5 ANG JAW (MISCELLANEOUS)
CHLORAPREP W/TINT 26 (MISCELLANEOUS) ×2 IMPLANT
CLIP APPLIE 5 13 M/L LIGAMAX5 (MISCELLANEOUS) IMPLANT
COVER MAYO STAND STRL (DRAPES) ×2 IMPLANT
COVER SURGICAL LIGHT HANDLE (MISCELLANEOUS) ×2 IMPLANT
COVER TIP SHEARS 8 DVNC (MISCELLANEOUS) IMPLANT
COVER TIP SHEARS 8MM DA VINCI (MISCELLANEOUS)
DECANTER SPIKE VIAL GLASS SM (MISCELLANEOUS) ×1 IMPLANT
DEFOGGER SCOPE WARMER CLEARIFY (MISCELLANEOUS) ×2 IMPLANT
DERMABOND ADVANCED (GAUZE/BANDAGES/DRESSINGS) ×1
DERMABOND ADVANCED .7 DNX12 (GAUZE/BANDAGES/DRESSINGS) ×1 IMPLANT
DEVICE TROCAR PUNCTURE CLOSURE (ENDOMECHANICALS) ×2 IMPLANT
DRAIN PENROSE 0.5X18 (DRAIN) IMPLANT
DRAPE ARM DVNC X/XI (DISPOSABLE) ×4 IMPLANT
DRAPE CARDIOVASC SPLIT 88X140 (DRAPES) ×2 IMPLANT
DRAPE COLUMN DVNC XI (DISPOSABLE) ×1 IMPLANT
DRAPE DA VINCI XI ARM (DISPOSABLE) ×8
DRAPE DA VINCI XI COLUMN (DISPOSABLE) ×2
DRAPE ORTHO SPLIT 77X108 STRL (DRAPES) ×2
DRAPE SURG ORHT 6 SPLT 77X108 (DRAPES) ×1 IMPLANT
ELECT REM PT RETURN 9FT ADLT (ELECTROSURGICAL) ×2
ELECTRODE REM PT RTRN 9FT ADLT (ELECTROSURGICAL) ×1 IMPLANT
GLOVE BIO SURGEON STRL SZ7.5 (GLOVE) ×4 IMPLANT
GOWN STRL REUS W/ TWL XL LVL3 (GOWN DISPOSABLE) ×2 IMPLANT
GOWN STRL REUS W/TWL 2XL LVL3 (GOWN DISPOSABLE) ×1 IMPLANT
GOWN STRL REUS W/TWL XL LVL3 (GOWN DISPOSABLE) ×4
KIT BASIN OR (CUSTOM PROCEDURE TRAY) ×2 IMPLANT
KIT TURNOVER KIT B (KITS) IMPLANT
MARKER SKIN DUAL TIP RULER LAB (MISCELLANEOUS) ×2 IMPLANT
MESH BIO-A 7X10 SYN MAT (Mesh General) ×2 IMPLANT
NDL INSUFFLATION 14GA 120MM (NEEDLE) ×1 IMPLANT
NEEDLE 22X1 1/2 (OR ONLY) (NEEDLE) ×2 IMPLANT
NEEDLE INSUFFLATION 14GA 120MM (NEEDLE) ×2 IMPLANT
OBTURATOR OPTICAL STANDARD 8MM (TROCAR)
OBTURATOR OPTICAL STND 8 DVNC (TROCAR)
OBTURATOR OPTICALSTD 8 DVNC (TROCAR) IMPLANT
PENCIL SMOKE EVACUATOR (MISCELLANEOUS) IMPLANT
SCISSORS LAP 5X35 DISP (ENDOMECHANICALS) ×1 IMPLANT
SEAL CANN UNIV 5-8 DVNC XI (MISCELLANEOUS) ×4 IMPLANT
SEAL XI 5MM-8MM UNIVERSAL (MISCELLANEOUS) ×8
SEALER VESSEL DA VINCI XI (MISCELLANEOUS) ×2
SEALER VESSEL EXT DVNC XI (MISCELLANEOUS) ×1 IMPLANT
SET IRRIG TUBING LAPAROSCOPIC (IRRIGATION / IRRIGATOR) ×2 IMPLANT
SET TUBE SMOKE EVAC HIGH FLOW (TUBING) ×2 IMPLANT
STAPLER VISISTAT 35W (STAPLE) IMPLANT
STOPCOCK 4 WAY LG BORE MALE ST (IV SETS) ×2 IMPLANT
SUT ETHIBOND 0 36 GRN (SUTURE) ×4 IMPLANT
SUT ETHIBOND 2 0 SH (SUTURE)
SUT ETHIBOND 2 0 SH 36X2 (SUTURE) IMPLANT
SUT MNCRL AB 4-0 PS2 18 (SUTURE) ×3 IMPLANT
SUT SILK 0 SH 30 (SUTURE) ×4 IMPLANT
SYR 30ML SLIP (SYRINGE) ×2 IMPLANT
TRAY FOLEY MTR SLVR 16FR STAT (SET/KITS/TRAYS/PACK) ×2 IMPLANT
TRAY LAPAROSCOPIC MC (CUSTOM PROCEDURE TRAY) ×2 IMPLANT
TROCAR ADV FIXATION 5X100MM (TROCAR) ×2 IMPLANT

## 2020-12-27 NOTE — Op Note (Signed)
12/27/2020  9:47 AM  PATIENT:  Meredith Fernandez  70 y.o. female  PRE-OPERATIVE DIAGNOSIS:  HIATAL HERNIA  POST-OPERATIVE DIAGNOSIS:  HIATAL HERNIA  PROCEDURE:  Procedure(s): XI ROBOTIC ASSISTED HIATAL HERNIA REPAIR WITH MESH (N/A) XI ROBOTIC ASSISTED LAPAROSCOPIC TOUPET FUNDOPLICATION (N/A) INSERTION OF MESH (N/A)  SURGEON:  Surgeon(s) and Role:    * Ralene Ok, MD - Primary  ANESTHESIA:   local and general  EBL:  minimal   BLOOD ADMINISTERED:none  DRAINS: none   LOCAL MEDICATIONS USED:  BUPIVICAINE  and OTHER EXPARIL  SPECIMEN:  No Specimen  DISPOSITION OF SPECIMEN:  N/A  COUNTS:  YES  TOURNIQUET:  * No tourniquets in log *  DICTATION: .Dragon Dictation  The patient was taken back to the operating room and placed in the supine position with bilateral SCDs in place. The patient was prepped and draped in the usual sterile fashion. After appropriate antibiotics were confirmed a timeout was called and all facts were verified.   A Veress needle technique was used to insufflate the abdomen to 15 mm of mercury the paramedian stab incision. Subsequent to this an 8 mm trocar was introduced as was a 8 millimeter camera. At this time the subsequent robotic trochars x3, were then placed adjacent to this trocar approximately 8-10 cm away. Each trocar was inserted under direct visualization, there were total of 4 trochars. The assistant trocar was then placed in the right lower quadrant under direct visualization. The Nathanson retractor was then visualized inserted into the abdomen and the incision just to the left of the falciform ligament. This was then placed to retract the liver appropriately. At this time the patient was positioned in reverse Trendelenburg.   At this time the robot patient cart was brought to the bedside and placed in good position and the arms were docked to the trochars appropriately. At this time I proceeded to incised the gastrohepatic ligament.  At this  time I proceeded to mobilize the stomach inferiorly and visualize the right crus. The peritoneum over the right crus was incised and right crus was identified. I proceeded to dissect this inferiorly until the left crus was seen joining the right crus. Once the right crus was adequately dissected we turned our to the left crus which was dissected away. This required traction of the stomach to the right side. Once this was visualized we then proceeded to circumferentially dissect the esophagus away from the surrounding tissue. There was a very large and redundant hernia sac that was required to be reduced. The anterior and posterior vagus was seen along the esophagus at the GE junction.  These were both preserved throughout the entire case.  At this time the phrenoesophageal fat pad was dissected away from the esophagus. There was a large-sized hiatal hernia seen. I mobilized the esophagus cephalad approximately 4-5 cm, clearing away the surrounding tissue. The anterior hernia sac was dissected away from the stomach and esophagus and discarded.  At this time we turned our attention to the greater curvature the stomach and the omentum was mobilized using the robotic vessel sealer. This was taken up to the greater curvature to the hiatus. This mobilized the entire greater curvature to allow mobilization and the wrap. I then proceeded to bring the greater curvature the stomach posterior to the esophagus, and a shoeshine technique was used to evaluate the mobilization of the greater curvature.   At this time I proceeded to close the hiatus using interrupted 0 Ethibonds x 3. This brought together  the hiatal closure without undue stricture to the esophagus.   A piece of Gore Bio A hiatal mesh was placed over the hiatal closure and sutured to the crus using 0 Ethibond sutures x 4.  At this time the greater curvature was brought around the esophagus and sutured using 0 silk sutures interrupted fashion approximately 1  cm apart x3 on each side of the esophagus in a Toupet fashion. A left collar stitch was then used to gastropexy the stomach from the wrap to the diaphragm just lateral to the left crus as.   The wrap lay posterior on its own with undue tension.  The wrap lay loose with no strangulation of the esophagus.  At this time the robot was undocked. The liver trocar was removed. At this time insufflation was evacuated. Skin was reapproximated for Monocryl subcuticular fashion. The skin was then dressed with Dermabond. The patient tolerated the procedure well and was taken to the recovery room in stable condition.    PLAN OF CARE: Admit for overnight observation  PATIENT DISPOSITION:  PACU - hemodynamically stable.   Delay start of Pharmacological VTE agent (>24hrs) due to surgical blood loss or risk of bleeding: yes

## 2020-12-27 NOTE — Progress Notes (Signed)
Brief Nutrition Education Note  RD consulted for nutrition education regarding pureed diet s/p nissen.    Pt unavailable at time of visit.   RD provided "IDSSI Level 4 Pureed Meredith Fernandez) Nutrition Therapy" handout from the Academy of [employer] and Dietetics. Attached to AVS/ discharge summary.   Current diet order is NPO, patient is consuming approximately n/a% of meals at this time. Labs and medications reviewed. No further nutrition interventions warranted at this time. RD contact information provided. If additional nutrition issues arise, please re-consult RD.  Loistine Chance, RD, LDN, [place] Registered Dietitian II Certified Diabetes Care and Education Specialist Please refer to [facility] for RD and/or RD on-call/weekend/after hours pager

## 2020-12-27 NOTE — Anesthesia Procedure Notes (Addendum)
Procedure Name: Intubation Date/Time: 12/27/2020 7:38 AM Performed by: Albertha Ghee, MD Pre-anesthesia Checklist: Patient identified, Emergency Drugs available, Suction available and Patient being monitored Patient Re-evaluated:Patient Re-evaluated prior to induction Oxygen Delivery Method: Circle System Utilized Preoxygenation: Pre-oxygenation with 100% oxygen Induction Type: IV induction Ventilation: Mask ventilation without difficulty and Oral airway inserted - appropriate to patient size Laryngoscope Size: Mac and 3 Grade View: Grade III Tube type: Oral Tube size: 7.0 mm Number of attempts: 1 Airway Equipment and Method: Stylet and Oral airway Placement Confirmation: ETT inserted through vocal cords under direct vision,  positive ETCO2 and breath sounds checked- equal and bilateral Secured at: 21 cm Tube secured with: Tape Dental Injury: Teeth and Oropharynx as per pre-operative assessment  Comments: Grade III view with Miller 2 by CRNA, unable to pass ETT, Grade III view with Mac 3 by MDA, ETT placed.

## 2020-12-27 NOTE — Transfer of Care (Signed)
Immediate Anesthesia Transfer of Care Note  Patient: Meredith Fernandez  Procedure(s) Performed: XI ROBOTIC ASSISTED HIATAL HERNIA REPAIR WITH MESH (N/A Abdomen) XI ROBOTIC ASSISTED LAPAROSCOPIC NISSEN FUNDOPLICATION (N/A Abdomen) INSERTION OF MESH (N/A Abdomen)  Patient Location: PACU  Anesthesia Type:General  Level of Consciousness: awake, alert  and oriented  Airway & Oxygen Therapy: Patient Spontanous Breathing and Patient connected to face mask oxygen  Post-op Assessment: Report given to RN and Post -op Vital signs reviewed and stable  Post vital signs: Reviewed and stable  Last Vitals:  Vitals Value Taken Time  BP 147/77 12/27/20 1008  Temp 36.9 C 12/27/20 1008  Pulse 85 12/27/20 1015  Resp 13 12/27/20 1015  SpO2 94 % 12/27/20 1015  Vitals shown include unvalidated device data.  Last Pain:  Vitals:   12/27/20 1008  TempSrc:   PainSc: 0-No pain      Patients Stated Pain Goal: 3 (77/03/40 3524)  Complications: No complications documented.

## 2020-12-27 NOTE — H&P (Signed)
History of Present Illness The patient is a 70 year old female who presents with a hiatal hernia. Patient is a 70 year old female who follows back today after radiological studies. Patient's been doing well from symptomatic standpoint. Patient's studies to include her gastric emptying study-normal, upper GI-which showed a large hiatal hernia, and CT scan which showed a 3.4 cm hiatal hernia with approximately two thirds of her stomach in her chest cavity, were reviewed with the patient. These personally.  Patient states that she is not having any further issues with retching or chest pain. I do feel that the hernia is likely the cause of her retching, undigested food in nausea vomiting couple chest pain.  ---------------------------------------------------------------- Patient is a 70 year old female who is referred by Dr. Alan Ripper for evaluation of a hiatal hernia. Patient states that she's had multiple episodes of nausea vomiting, retching with most recent being a couple days ago. Patient states that minimally she vomits undigested food. States that can be a couple days of food that she vomits. Patient underwent esophagogram which revealed a large hiatal hernia within the chest cavity. I did review this personally. Patient states that she's had no issues with reflux. She did undergo an endoscopy I do not have the report currently but she states that did show some signs of reflux, no ulcers or erosions.  Patient had a previous C-section in the past.  Patient has not had any manometry or gastric emptying stud, or CT scan y at this point.    Allergies No Known Drug Allergies  [08/17/2020]: Allergies Reconciled   Medication History Pravastatin Sodium (10MG  Tablet, Oral) Active. hydroCHLOROthiazide (50MG  Tablet, Oral) Active. Medications Reconciled    Review of Systems  General Not Present- Appetite Loss, Chills, Fatigue, Fever, Night Sweats, Weight Gain and Weight  Loss. Skin Not Present- Change in Wart/Mole, Dryness, Hives, Jaundice, New Lesions, Non-Healing Wounds, Rash and Ulcer. HEENT Present- Seasonal Allergies and Wears glasses/contact lenses. Not Present- Earache, Hearing Loss, Hoarseness, Nose Bleed, Oral Ulcers, Ringing in the Ears, Sinus Pain, Sore Throat, Visual Disturbances and Yellow Eyes. Respiratory Not Present- Bloody sputum, Chronic Cough, Difficulty Breathing, Snoring and Wheezing. Breast Not Present- Breast Mass, Breast Pain, Nipple Discharge and Skin Changes. Cardiovascular Present- Swelling of Extremities. Not Present- Chest Pain, Difficulty Breathing Lying Down, Leg Cramps, Palpitations, Rapid Heart Rate and Shortness of Breath. Gastrointestinal Not Present- Abdominal Pain, Bloating, Bloody Stool, Change in Bowel Habits, Chronic diarrhea, Constipation, Difficulty Swallowing, Excessive gas, Gets full quickly at meals, Hemorrhoids, Indigestion, Nausea, Rectal Pain and Vomiting. Female Genitourinary Not Present- Frequency, Nocturia, Painful Urination, Pelvic Pain and Urgency. Musculoskeletal Present- Swelling of Extremities. Not Present- Back Pain, Joint Pain, Joint Stiffness, Muscle Pain and Muscle Weakness. Neurological Not Present- Decreased Memory, Fainting, Headaches, Numbness, Seizures, Tingling, Tremor, Trouble walking and Weakness. Psychiatric Not Present- Anxiety, Bipolar, Change in Sleep Pattern, Depression, Fearful and Frequent crying. Endocrine Not Present- Cold Intolerance, Excessive Hunger, Hair Changes, Heat Intolerance, Hot flashes and New Diabetes. Hematology Not Present- Blood Thinners, Easy Bruising, Excessive bleeding, Gland problems, HIV and Persistent Infections. All other systems negative  BP (!) 179/75   Pulse 88   Temp 98 F (36.7 C) (Oral)   Resp 18   Ht 5\' 3"  (1.6 m)   Wt 120.4 kg   SpO2 97%   BMI 47.02 kg/m      Physical Exam  The physical exam findings are as follows: Note: Constitutional: No  acute distress, conversant, appears stated age  Eyes: Anicteric sclerae, moist conjunctiva, no lid  lag  Neck: No thyromegaly, trachea midline, no cervical lymphadenopathy  Lungs: Clear to auscultation biilaterally, normal respiratory effot  Cardiovascular: regular rate & rhythm, no murmurs, no peripheal edema, pedal pulses 2+  GI: Soft, no masses or hepatosplenomegaly, non-tender to palpation  MSK: Normal gait, no clubbing cyanosis, edema  Skin: No rashes, palpation reveals normal skin turgor  Psychiatric: Appropriate judgment and insight, oriented to person, place, and time    Assessment & Plan  HIATAL HERNIA (K44.9) Impression: 70 year old female with a large hiatal hernia. 1. Will proceed to the operating room for a robotic hiatal hernia repair with mesh and fundoplication  2. Discussed with patient the risks and benefits of the procedure to include but not limited to: Infection, bleeding, damage to structures, possible pneumothorax, possible recurrence. The patient voiced understanding and wishes to proceed.

## 2020-12-27 NOTE — Discharge Instructions (Addendum)
EATING AFTER YOUR ESOPHAGEAL SURGERY (Stomach Fundoplication, Hiatal Hernia repair, Achalasia surgery, etc)  ######################################################################  EAT Start with a pureed / full liquid diet (see below) Gradually transition to a high fiber diet with a fiber supplement over the next month after discharge.    WALK Walk an hour a day.  Control your pain to do that.    CONTROL PAIN Control pain so that you can walk, sleep, tolerate sneezing/coughing, go up/down stairs.  HAVE A BOWEL MOVEMENT DAILY Keep your bowels regular to avoid problems.  OK to try a laxative to override constipation.  OK to use an antidairrheal to slow down diarrhea.  Call if not better after 2 tries  CALL IF YOU HAVE PROBLEMS/CONCERNS Call if you are still struggling despite following these instructions. Call if you have concerns not answered by these instructions  ######################################################################   After your esophageal surgery, expect some sticking with swallowing over the next 1-2 months.    If food sticks when you eat, it is called "dysphagia".  This is due to swelling around your esophagus at the wrap & hiatal diaphragm repair.  It will gradually ease off over the next few months.  To help you through this temporary phase, we start you out on a pureed (blenderized) diet.  Your first meal in the hospital was thin liquids.  You should have been given a pureed diet by the time you left the hospital.  We ask patients to stay on a pureed diet for the first 2-3 weeks to avoid anything getting "stuck" near your recent surgery.  Don't be alarmed if your ability to swallow doesn't progress according to this plan.  Everyone is different and some diets can advance more or less quickly.    It is often helpful to crush your medications or split them as they can sometimes stick, especially the first week or so.   Some BASIC RULES to follow  are:  Maintain an upright position whenever eating or drinking.  Take small bites - just a teaspoon size bite at a time.  Eat slowly.  It may also help to eat only one food at a time.  Consider nibbling through smaller, more frequent meals & avoid the urge to eat BIG meals  Do not push through feelings of fullness, nausea, or bloatedness  Do not mix solid foods and liquids in the same mouthful  Try not to "wash foods down" with large gulps of liquids.  Avoid carbonated (bubbly/fizzy) drinks.    Avoid foods that make you feel gassy or bloated.  Start with bland foods first.  Wait on trying greasy, fried, or spicy meals until you are tolerating more bland solids well.  Understand that it will be hard to burp and belch at first.  This gradually improves with time.  Expect to be more gassy/flatulent/bloated initially.  Walking will help your body manage it better.  Consider using medications for bloating that contain simethicone such as  Maalox or Gas-X   Consider crushing her medications, especially smaller pills.  The ability to swallow pills should get easier after a few weeks  Eat in a relaxed atmosphere & minimize distractions.  Avoid talking while eating.    Do not use straws.  Following each meal, sit in an upright position (90 degree angle) for 60 to 90 minutes.  Going for a short walk can help as well  If food does stick, don't panic.  Try to relax and let the food pass on its own.    Sipping WARM LIQUID such as strong hot black tea can also help slide it down.   Be gradual in changes & use common sense:  -If you easily tolerating a certain "level" of foods, advance to the next level gradually -If you are having trouble swallowing a particular food, then avoid it.   -If food is sticking when you advance your diet, go back to thinner previous diet (the lower LEVEL) for 1-2 days.  LEVEL 1 = PUREED DIET  Do for the first 2 WEEKS AFTER SURGERY  -Foods in this group are  pureed or blenderized to a smooth, mashed potato-like consistency.  -If necessary, the pureed foods can keep their shape with the addition of a thickening agent.   -Meat should be pureed to a smooth, pasty consistency.  Hot broth or gravy may be added to the pureed meat, approximately 1 oz. of liquid per 3 oz. serving of meat. -CAUTION:  If any foods do not puree into a smooth consistency, swallowing will be more difficult.  (For example, nuts or seeds sometimes do not blend well.)  Hot Foods Cold Foods  Pureed scrambled eggs and cheese Pureed cottage cheese  Baby cereals Thickened juices and nectars  Thinned cooked cereals (no lumps) Thickened milk or eggnog  Pureed [place] toast or pancakes Ensure  Mashed potatoes Ice cream  Pureed parsley, au gratin, scalloped potatoes, candied sweet potatoes Fruit or [place] ice, sherbet  Pureed buttered or alfredo noodles Plain yogurt  Pureed vegetables (no corn or peas) Instant breakfast  Pureed soups and creamed soups Smooth pudding, mousse, custard  Pureed scalloped apples Whipped gelatin  Gravies Sugar, syrup, honey, jelly  Sauces, cheese, tomato, barbecue, white, creamed Cream  Any baby food Creamer  Alcohol in moderation (not beer or champagne) Margarine  Coffee or tea Mayonnaise   Ketchup, mustard   Apple sauce   SAMPLE MENU:  PUREED DIET Breakfast Lunch Dinner   Orange juice, 1/2 cup  Cream of wheat, 1/2 cup  Pineapple juice, 1/2 cup  Pureed [place], barley soup, 3/4 cup  Pureed Hawaiian chicken, 3 oz   Scrambled eggs, mashed or blended with cheese, 1/2 cup  Tea or coffee, 1 cup   Whole milk, 1 cup   Non-dairy creamer, 2 Tbsp.  Mashed potatoes, 1/2 cup  Pureed cooled broccoli, 1/2 cup  Apple sauce, 1/2 cup  Coffee or tea  Mashed potatoes, 1/2 cup  Pureed spinach, 1/2 cup  Frozen yogurt, 1/2 cup  Tea or coffee      LEVEL 2 = SOFT DIET  After your first 2 weeks, you can advance to a soft diet.   Keep on this  diet until everything goes down easily.  Hot Foods Cold Foods  White fish Cottage cheese  Stuffed fish Junior baby fruit  Baby food meals Semi thickened juices  Minced soft cooked, scrambled, poached eggs nectars  Souffle & omelets Ripe mashed bananas  Cooked cereals Canned fruit, pineapple sauce, milk  potatoes Milkshake  Buttered or Alfredo noodles Custard  Cooked cooled vegetable Puddings, including tapioca  Sherbet Yogurt  Vegetable soup or alphabet soup Fruit ice, [place] ice  Gravies Whipped gelatin  Sugar, syrup, honey, jelly Junior baby desserts  Sauces:  Cheese, creamed, barbecue, tomato, white Cream  Coffee or tea Margarine   SAMPLE MENU:  LEVEL 2 Breakfast Lunch Dinner   Orange juice, 1/2 cup  Oatmeal, 1/2 cup  Scrambled eggs with cheese, 1/2 cup  Decaffeinated tea, 1 cup  Whole milk, 1 cup  Non-dairy creamer, 2 Tbsp  Pineapple juice, 1/2 cup  Minced beef, 3 oz  Gravy, 2 Tbsp  Mashed potatoes, 1/2 cup  Minced fresh broccoli, 1/2 cup  Applesauce, 1/2 cup  Coffee, 1 cup  [place], barley soup, 3/4 cup  Minced Hawaiian chicken, 3 oz  Mashed potatoes, 1/2 cup  Cooked spinach, 1/2 cup  Frozen yogurt, 1/2 cup  Non-dairy creamer, 2 Tbsp      LEVEL 3 = CHOPPED DIET  -After all the foods in level 2 (soft diet) are passing through well you should advance up to more chopped foods.  -It is still important to cut these foods into small pieces and eat slowly.  Hot Foods Cold Foods  Poultry Cottage cheese  Chopped Swedish meatballs Yogurt  Meat salads (ground or flaked meat) Milk  Flaked fish (tuna) Milkshakes  Poached or scrambled eggs Soft, cold, dry cereal  Souffles and omelets Fruit juices or nectars  Cooked cereals Chopped canned fruit  Chopped French toast or pancakes Canned fruit cocktail  Noodles or pasta (no rice) Pudding, mousse, custard  Cooked vegetables (no frozen peas, corn, or mixed vegetables) Green salad  Canned small sweet peas  Ice cream  Creamed soup or vegetable soup Fruit ice, Italian ice  Pureed vegetable soup or alphabet soup Non-dairy creamer  Ground scalloped apples Margarine  Gravies Mayonnaise  Sauces:  Cheese, creamed, barbecue, tomato, white Ketchup  Coffee or tea Mustard   SAMPLE MENU:  LEVEL 3 Breakfast Lunch Dinner   Orange juice, 1/2 cup  Oatmeal, 1/2 cup  Scrambled eggs with cheese, 1/2 cup  Decaffeinated tea, 1 cup  Whole milk, 1 cup  Non-dairy creamer, 2 Tbsp  Ketchup, 1 Tbsp  Margarine, 1 tsp  Salt, 1/4 tsp  Sugar, 2 tsp  Pineapple juice, 1/2 cup  Ground beef, 3 oz  Gravy, 2 Tbsp  Mashed potatoes, 1/2 cup  Cooked spinach, 1/2 cup  Applesauce, 1/2 cup  Decaffeinated coffee  Whole milk  Non-dairy creamer, 2 Tbsp  Margarine, 1 tsp  Salt, 1/4 tsp  Pureed turkey, barley soup, 3/4 cup  Barbecue chicken, 3 oz  Mashed potatoes, 1/2 cup  Ground fresh broccoli, 1/2 cup  Frozen yogurt, 1/2 cup  Decaffeinated tea, 1 cup  Non-dairy creamer, 2 Tbsp  Margarine, 1 tsp  Salt, 1/4 tsp  Sugar, 1 tsp    LEVEL 4:  REGULAR FOODS  -Foods in this group are soft, moist, regularly textured foods.   -This level includes meat and breads, which tend to be the hardest things to swallow.   -Eat very slowly, chew well and continue to avoid carbonated drinks. -most people are at this level in 4-6 weeks  Hot Foods Cold Foods  Baked fish or skinned Soft cheeses - cottage cheese  Souffles and omelets Cream cheese  Eggs Yogurt  Stuffed shells Milk  Spaghetti with meat sauce Milkshakes  Cooked cereal Cold dry cereals (no nuts, dried fruit, coconut)  French toast or pancakes Crackers  Buttered toast Fruit juices or nectars  Noodles or pasta (no rice) Canned fruit  Potatoes (all types) Ripe bananas  Soft, cooked vegetables (no corn, lima, or baked beans) Peeled, ripe, fresh fruit  Creamed soups or vegetable soup Cakes (no nuts, dried fruit, coconut)  Canned chicken  noodle soup Plain doughnuts  Gravies Ice cream  Bacon dressing Pudding, mousse, custard  Sauces:  Cheese, creamed, barbecue, tomato, white Fruit ice, Italian ice, sherbet  Decaffeinated tea or coffee Whipped gelatin  Pork chops Regular gelatin     Canned fruited gelatin molds   Sugar, syrup, honey, jam, jelly   Cream   Non-dairy   Margarine   Oil   Mayonnaise   Ketchup   Mustard   TROUBLESHOOTING IRREGULAR BOWELS  1) Avoid extremes of bowel movements (no bad constipation/diarrhea)  2) Miralax 17gm mixed in 8oz. water or juice-daily. May use BID as needed.  3) Gas-x,Phazyme, etc. as needed for gas & bloating.  4) Soft,bland diet. No spicy,greasy,fried foods.  5) Prilosec over-the-counter as needed  6) May hold gluten/wheat products from diet to see if symptoms improve.  7) May try probiotics (Align, Activa, etc) to help calm the bowels down  7) If symptoms become worse call back immediately.    If you have any questions please call our office at [place]: [phone].    IDDSI Level 4 Pureed [place]) Nutrition Therapy  A level 4 puree diet is prescribed to patients who may have pain when chewing or swallowing or are unable to bite or chew foods. Foods in this diet should not require chewing and should fall off a spoon easily. These foods are easy to swallow because they are pureed smooth and free of lumps.  Your registered dietitian nutritionist (RDN) can help you figure out how to include your favorite foods while following this diet. How to Test Your Food Use a fork for drip and pressure tests and a spoon for tilt test to check if your foods are safe to eat while on this diet. Cooking method and serving temperature can affect texture, so be sure to test your foods just before you begin to eat. It is important to do both the fork drip test and the spoon tilt test when evaluating pureed foods.  IDDSI Fork Drip Test: Food should not have lumps, stays as a mound on  fork and has very little or no flow through prongs. A small amount may flow through but it does not drip continuously.   IDDSI Spoon Tilt Test: Food should hold its shape on the spoon but slide off easily with almost no food left on the spoon. You may need to gently flick the spoon to get food to fall off and there may be a small thin film left on the spoon. Food that doesnt fall off the spoon when tilted or sticks to the spoon is too thick.   IDDSI Fork Pressure Test: When fork is pressed into the food sample, tines make a pattern on the surface when fork is pulled through the food sample. The food may briefly show the fork indentation marks. To test gelled, molded, or shaped pureed foods: Confirm it is not too firm or too sticky. You will know its too firm if the pureed food can be cut into pieces or can be easily picked up with fingers. Puree food should not need to be chewed. You will know if its too sticky if it does not pass the spoon tilt test.   Tips  Prepare foods to make them smooth, lump free, and not too firm or sticky.  Use a food processor or blender to puree foods.  Add gravy, sauce, vegetable juice, cooking water, fruit juice, milk, or half-and-half to pureed foods to prevent lumps and provide moisture.  Serve foods with thick enough liquids so that the liquid does not run off the food or separate from the pureed solid foods. Thicken liquids to the consistency recommended by your clinician.  Strain the liquid or blend it in so the food  is one consistency. Some strategies:  ? Drain excess milk from smooth cereal ? Strain pureed fruits to remove excess fluid ? Create a roux from water and flour to thicken the broth, gravy or sauce from soups, stews and casseroles. Blend/puree together to make one food product.    Monitor leftover foods while re-heating to make sure they dont form a tough outer crust that could make the food harder to eat.   Foods Recommended and Not  Recommended Food lists are based on the International Dysphagia Diet Standardization Initiative (IDDSI) Framework. All foods that you eat must pass the IDDSI level 4 testing methods. The following table is not a complete list of foods recommended. Other foods may be OK to eat as long as they meet IDDSI testing requirements. Ask your RDN if you want to know the safety of other foods not included on this list. Food Group Foods Recommended Foods Not Recommended  Grains Pureed soft-cooked hot cereals with no lumps. Served without excess liquid. Soft breads, rolls, pastries, pancakes, [place] toast, muffins, donuts and bread dressing that have been pureed. Pre-gelled, soaked bread, cakes, cookies, and other grains that are consistently moist throughout without hard parts formed during sitting. Pureed, moist pasta, potatoes, and rice without lumps. Liquids/sauces do not separate from food. Any item that is not pureed or has lumps. Dry cereal, cooked cereal with lumps, cereal with seeds. Grainy, sticky, or glutinous rice. Rice that separates into individual grains when cooked or served. Pre-gelled, shaped, and molded puree foods that are too firm or sticky at serving temperature.  Protein Foods Pureed, moistened, tender protein foods that meet IDDSI Level 4 puree testing expectations: Red meat, including beef, pork, or lamb. Poultry, including skinless chicken or [place]. Seafood, including fish (salmon, herring, and sardines), shrimp, lobster, clams, and scallops. Pureed eggs and egg substitutes. Pureed, smooth casseroles with no liquid separating from the solid; moist with incorporated sauces/gravies. Pureed, moistened soy foods, such as tofu or tempeh. Pureed, moistened meat alternatives, such as veggie burgers, and sausages based on plant protein. Pureed, smooth, moistened legumes, such as dried beans, lentils, or peas. Protein foods not pureed into smooth, lump free items. Protein foods served with  undrained thin liquids. Chicken, [place] and fish with skin on or with bones. Chunky and smooth nut seed butters, unless used in a recipe that is pureed and meets testing expectations. Whole nuts and seeds, such as peanuts and almonds; pistachios and sunflower seeds.    Dairy and Dairy Alternatives Smooth yogurt (without nuts or coconut) or pureed; and pureed cottage cheese. Whipped cream cheese, sour cream, and whipped topping used in allowed recipes and as condiments. Frozen desserts such as ice cream, sherbet, malts, and frozen yogurt if recommended by your clinician. Milk, fortified soy milk, fortified nut milk in the liquid consistency recommended by your clinician. Yogurt with lumps, seeds, fruit pieces, nuts or coconut; yogurt that is too thin, separates into liquid or is too thick. Cheeses unless pureed into allowed recipe. Frozen desserts such as ice cream, sherbet, malts, and frozen yogurt unless approved by your clinician.   Vegetables Pureed cooked tender vegetables and potatoes. If indicated, serve in a thick and smooth sauce or gravy, draining excess. There should not be thin liquid separating from food. Smooth tomato sauce without seeds. Mashed potatoes and whipped sweet potatoes without skin Vegetable juices in the liquid consistency recommended by your clinician. All raw vegetables. Stir-fried or fried vegetables that do not puree into smooth, lump free product.  Fruit Pureed canned and cooked fruits, drained of excess juices; pureed fresh fruit if smooth and lump free with no separate of liquids. 100% fruit juice in the liquid consistency recommended by your clinician. Smooth, lump free pureed prunes and apricots that pass IDDSI puree testing expectations. All non-pureed fruits; seeds and skins. Stringy, high-pulp fruits such as papaya, pineapple, or mango that do not puree into smooth, lump free product. Uncooked dried fruits such as raisins, prunes, or apricots. Fruit  leather, fruit roll-ups, fruit snacks, dried fruits.  Beverages Coffee, tea, water, and nutritional supplements in the liquid consistency recommended by your clinician. Liquids not approved by your clinician.  Other Pureed foods, including all soups with tender meats, casseroles, baked goods, and snacks made from recommended ingredients that are smooth and lump free. All seasonings and sweeteners; honey if mixed into food that passes IDDSI testing expectations. Jelly    Level 4 Pureed (Green) Sample 1-Day Menu  Breakfast 1 scrambled egg, pureed   cup farina, prepared (served without excess liquid)  1 muffin, pureed 1 teaspoon butter   cup orange juice (in liquid consistency recommended by your clinician) 1 cup coffee (in liquid consistency recommended by your clinician) 1 cup 1% milk (in liquid consistency recommended by your clinician)  Lunch 1 cup moist pureed beef stew, excess liquid drained  cup pureed cottage cheese, excess liquid drained 1 slice pureed bread 1 teaspoon butter  cup pureed fruit cocktail, excess liquid drained  cup cinnamon custard, pureed as needed to meet IDDSI testing 1 cup 1% milk (in the liquid consistency recommended by your clinician)  Evening Meal 3 ounces moist, tender pureed chicken served with: 2 tablespoons of gravy, that does not separate from chicken (in the liquid consistency recommended by your clinician)  cup mashed potatoes  cup cooked pureed carrots  1 slice pureed bread  1 teaspoon butter  cup canned puree peaches, excess liquid drained  1 cup 1% milk (in the liquid consistency recommended by your clinician)  Evening Snack  cup vanilla pudding, smooth and passes IDDSI testing

## 2020-12-27 NOTE — Progress Notes (Signed)
PT TRANSFERRED WELL FROM BED TO CHAIR IN PACU.  SHE WAS ABLE TO STAY IN CHAIR UNTIL DISCHARGE FROM PACU AT 1428.

## 2020-12-27 NOTE — Anesthesia Preprocedure Evaluation (Signed)
Anesthesia Evaluation  Patient identified by MRN, date of birth, ID band Patient awake    Reviewed: Allergy & Precautions, H&P , NPO status , Patient's Chart, lab work & pertinent test results  Airway Mallampati: II   Neck ROM: full    Dental   Pulmonary neg pulmonary ROS,    breath sounds clear to auscultation       Cardiovascular negative cardio ROS   Rhythm:regular Rate:Normal     Neuro/Psych Multiple sclerosis  Neuromuscular disease    GI/Hepatic hiatal hernia, GERD  ,  Endo/Other    Renal/GU      Musculoskeletal   Abdominal   Peds  Hematology   Anesthesia Other Findings   Reproductive/Obstetrics                             Anesthesia Physical Anesthesia Plan  ASA: II  Anesthesia Plan: General   Post-op Pain Management:    Induction: Intravenous  PONV Risk Score and Plan: 3 and Ondansetron, Dexamethasone, Midazolam and Treatment may vary due to age or medical condition  Airway Management Planned: Oral ETT  Additional Equipment:   Intra-op Plan:   Post-operative Plan: Extubation in OR  Informed Consent: I have reviewed the patients History and Physical, chart, labs and discussed the procedure including the risks, benefits and alternatives for the proposed anesthesia with the patient or authorized representative who has indicated his/her understanding and acceptance.     Dental advisory given  Plan Discussed with: CRNA, Anesthesiologist and Surgeon  Anesthesia Plan Comments:         Anesthesia Quick Evaluation

## 2020-12-28 ENCOUNTER — Inpatient Hospital Stay (HOSPITAL_COMMUNITY): Payer: Medicare HMO

## 2020-12-28 ENCOUNTER — Encounter (HOSPITAL_COMMUNITY): Payer: Self-pay | Admitting: General Surgery

## 2020-12-28 MED ORDER — IOHEXOL 300 MG/ML  SOLN
150.0000 mL | Freq: Once | INTRAMUSCULAR | Status: AC | PRN
  Administered 2020-12-28: 100 mL via ORAL

## 2020-12-28 NOTE — Discharge Summary (Signed)
Physician Discharge Summary  Patient ID: Meredith Fernandez MRN: [id number] DOB/AGE: 11-23-1950 70 y.o.  Admit date: 12/27/2020 Discharge date: 12/28/2020  Admission Diagnoses:hiatal hernia  Discharge Diagnoses:  Active Problems:   S/P Nissen fundoplication (without gastrostomy tube) procedure   Discharged Condition: good  Hospital Course: PT admitted post op to floor. Pt was ambulating well on her own with no pain rx Pt had esophagram and showed no leak,.  She was started on a liquid diet.  She was deemed stable for DC and Dc'd home  Consults: none  Significant Diagnostic Studies: esophagram- no leak  Treatments: surgery: as above  Discharge Exam: Blood pressure 126/64, pulse 81, temperature 98.2 F (36.8 C), temperature source Oral, resp. rate 17, height 5\' 3"  (1.6 m), weight 120.4 kg, SpO2 96 %. General appearance: alert and cooperative GI: soft, non-tender; bowel sounds normal; no masses,  no organomegaly and inc c/d/i  Disposition: Discharge disposition: 01-Home or Self Care       Discharge Instructions    Diet - low sodium heart healthy   Complete by: As directed    Increase activity slowly   Complete by: As directed      Allergies as of 12/28/2020   No Known Allergies     Medication List    TAKE these medications   acetaminophen 650 MG CR tablet Commonly known as: TYLENOL Take 1,300 mg by mouth every 8 (eight) hours as needed for pain (Hand pain).   CALCIUM CITRATE PO Take 600 mg of elemental calcium by mouth daily. D3   famotidine 10 MG tablet Commonly known as: PEPCID Take 10 mg by mouth daily.   hydrochlorothiazide 50 MG tablet Commonly known as: HYDRODIURIL Take 50 mg by mouth daily.   multivitamin with minerals tablet Take 1 tablet by mouth daily. Woman's plus   pravastatin 10 MG tablet Commonly known as: PRAVACHOL Take 10 mg by mouth daily.   VITAMIN E PO Take 800 Units by mouth daily.       Follow-up Information    Ralene Ok, MD. Schedule an appointment as soon as possible for a visit in 2 week(s).   Specialty: General Surgery Contact information: [address] [place] [address] [phone]               Signed: Ralene Ok 12/28/2020, 8:48 AM

## 2020-12-28 NOTE — Anesthesia Postprocedure Evaluation (Signed)
Anesthesia Post Note  Patient: Meredith Fernandez  Procedure(s) Performed: XI ROBOTIC ASSISTED HIATAL HERNIA REPAIR WITH MESH (N/A Abdomen) XI ROBOTIC ASSISTED LAPAROSCOPIC NISSEN FUNDOPLICATION (N/A Abdomen) INSERTION OF MESH (N/A Abdomen)     Patient location during evaluation: PACU Anesthesia Type: General Level of consciousness: awake and alert Pain management: pain level controlled Vital Signs Assessment: post-procedure vital signs reviewed and stable Respiratory status: spontaneous breathing, nonlabored ventilation, respiratory function stable and patient connected to nasal cannula oxygen Cardiovascular status: blood pressure returned to baseline and stable Postop Assessment: no apparent nausea or vomiting Anesthetic complications: no   No complications documented.  Last Vitals:  Vitals:   12/28/20 0030 12/28/20 0534  BP: 137/63 126/64  Pulse: 83 81  Resp: 18 17  Temp: 37.1 C 36.8 C  SpO2: 95% 96%    Last Pain:  Vitals:   12/28/20 0534  TempSrc: Oral  PainSc:                  Cash

## 2020-12-28 NOTE — Progress Notes (Signed)
Discharge instructions given to pt. Pt verbalizes understanding. IV removed, pt discharged.

## 2020-12-28 NOTE — Progress Notes (Signed)
1 Day Post-Op   Subjective/Chief Complaint: Pt doing well this AM Ambulating well Min pain   Objective: Vital signs in last 24 hours: Temp:  [98.2 F (36.8 C)-99.1 F (37.3 C)] 98.2 F (36.8 C) (02/23 0534) Pulse Rate:  [70-93] 81 (02/23 0534) Resp:  [9-18] 17 (02/23 0534) BP: (126-158)/(61-87) 126/64 (02/23 0534) SpO2:  [92 %-100 %] 96 % (02/23 0534)    Intake/Output from previous day: 02/22 0701 - 02/23 0700 In: 1165 [I.V.:1115; IV Piggyback:50] Out: 25 [Blood:25] Intake/Output this shift: No intake/output data recorded.  General appearance: alert and cooperative GI: soft, non-tender; bowel sounds normal; no masses,  no organomegaly and inc c/d/i Studies/Results: No results found.  Anti-infectives: Anti-infectives (From admission, onward)   Start     Dose/Rate Route Frequency Ordered Stop   12/27/20 0600  ceFAZolin (ANCEF) 3 g in dextrose 5 % 50 mL IVPB        3 g 100 mL/hr over 30 Minutes Intravenous On call to O.R. 12/26/20 1319 12/27/20 1400      Assessment/Plan: s/p Procedure(s): XI ROBOTIC ASSISTED HIATAL HERNIA REPAIR WITH MESH (N/A) XI ROBOTIC ASSISTED LAPAROSCOPIC NISSEN FUNDOPLICATION (N/A) INSERTION OF MESH (N/A) await esophagram.  If OK and no leak will start fulls  Home later today if doing well   LOS: 1 day    Ralene Ok 12/28/2020

## 2021-01-19 ENCOUNTER — Other Ambulatory Visit: Payer: Self-pay | Admitting: Adult Medicine

## 2021-01-19 DIAGNOSIS — R222 Localized swelling, mass and lump, trunk: Secondary | ICD-10-CM

## 2021-02-12 ENCOUNTER — Other Ambulatory Visit: Payer: Medicare HMO

## 2021-07-25 NOTE — Progress Notes (Signed)
[facility] Note  07/28/2021     CHIEF COMPLAINT Patient presents for Retina Follow Up   HISTORY OF PRESENT ILLNESS: Meredith Fernandez is a 70 y.o. female who presents to the clinic today for:   HPI     Retina Follow Up   Patient presents with  Other.  In left eye.  This started 1 year ago.  I, the attending physician,  performed the HPI with the patient and updated documentation appropriately.        Comments   Patient here for 1 year retina follow up for ERM OS. Patient states vision doing ok. Floaters are driving crazy. Some days worse than other days. No eye pain. Had cancer removed from same area that was removed from before. On anti estrogen medicine. Saw Dr Katy Fitch in June. Only a slight change. No need to change glasses.       Last edited by Bernarda Caffey, MD on 07/31/2021  8:51 PM.    pt states vision is stable, she states her left eye has several large floaters, which are bothering her    Referring physician: Clent Jacks, MD [address] [place],  [place] [address]  HISTORICAL INFORMATION:   Selected notes from the MEDICAL RECORD NUMBER Referred by Dr. Midge Aver for concern of VH/PVD OS LEE: 03.03.20 (C. Groat) [BCVA: OD: 20/40 OS: 20/30]  Ocular Hx-cataracts OU PMH-MS, breast cancer    CURRENT MEDICATIONS: No current outpatient medications on file. (Ophthalmic Drugs)   No current facility-administered medications for this visit. (Ophthalmic Drugs)   Current Outpatient Medications (Other)  Medication Sig   acetaminophen (TYLENOL) 650 MG CR tablet Take 1,300 mg by mouth every 8 (eight) hours as needed for pain (Hand pain).   CALCIUM CITRATE PO Take 600 mg of elemental calcium by mouth daily. D3   COVID-19 mRNA vaccine, Pfizer, 30 MCG/0.3ML injection USE AS DIRECTED   famotidine (PEPCID) 10 MG tablet Take 10 mg by mouth daily.   hydrochlorothiazide (HYDRODIURIL) 50 MG tablet Take 50 mg by mouth daily.   Multiple  Vitamins-Minerals (MULTIVITAMIN WITH MINERALS) tablet Take 1 tablet by mouth daily. Woman's plus   pravastatin (PRAVACHOL) 10 MG tablet Take 10 mg by mouth daily.   VITAMIN E PO Take 800 Units by mouth daily.   No current facility-administered medications for this visit. (Other)    REVIEW OF SYSTEMS: ROS   Positive for: Eyes Negative for: Constitutional, Gastrointestinal, Neurological, Skin, Genitourinary, Musculoskeletal, HENT, Endocrine, Cardiovascular, Respiratory, Psychiatric, Allergic/Imm, Heme/Lymph Last edited by Theodore Demark, COA on 07/28/2021  8:50 AM.      ALLERGIES No Known Allergies  PAST MEDICAL HISTORY Past Medical History:  Diagnosis Date   Acid reflux    Cancer ([facility])    Cataract    OD   History of hiatal hernia 2015   Hypercholesteremia    Multiple sclerosis ([facility]) 2001   Neuromuscular disorder ([facility]) 2001   Multiple Sclerosis   Pneumonia    Vitamin D deficiency    Past Surgical History:  Procedure Laterality Date   ABDOMINAL HYSTERECTOMY     BREAST SURGERY Bilateral 2015   CESAREAN SECTION     DILATION AND CURETTAGE OF UTERUS     ESOPHAGEAL MANOMETRY N/A 11/09/2020   Procedure: ESOPHAGEAL MANOMETRY (EM);  Surgeon: Mauri Pole, MD;  Location: [facility];  Service: Endoscopy;  Laterality: N/A;   INSERTION OF MESH N/A 12/27/2020   Procedure: INSERTION OF MESH;  Surgeon:  Ralene Ok, MD;  Location: [facility];  Service: General;  Laterality: N/A;   MASTECTOMY Bilateral 02/2014   XI ROBOTIC ASSISTED HIATAL HERNIA REPAIR N/A 12/27/2020   Procedure: XI ROBOTIC ASSISTED HIATAL HERNIA REPAIR WITH MESH;  Surgeon: Ralene Ok, MD;  Location: [facility];  Service: General;  Laterality: N/A;    FAMILY HISTORY Family History  Problem Relation Age of Onset   Hypertension Mother    Hyperlipidemia Mother    Hypertension Sister    Cancer Brother     SOCIAL HISTORY Social History   Tobacco Use   Smoking status: Never   Smokeless tobacco: Never   Vaping Use   Vaping Use: Never used  Substance Use Topics   Alcohol use: Yes    Comment: occasional wine   Drug use: Never       OPHTHALMIC EXAM:  Base Eye Exam     Visual Acuity (Snellen - Linear)       Right Left   Dist cc 20/25 20/20 -1   Dist ph cc NI     Correction: Glasses         Tonometry (Tonopen, 8:46 AM)       Right Left   Pressure 17 20         Pupils       Dark Light Shape React APD   Right 3 2 Round Brisk None   Left 3 2 Round Brisk None         Visual Fields (Counting fingers)       Left Right    Full Full         Extraocular Movement       Right Left    Full, Ortho Full, Ortho         Neuro/Psych     Oriented x3: Yes   Mood/Affect: Normal         Dilation     Both eyes: 1.0% Mydriacyl, 2.5% Phenylephrine @ 8:46 AM           Slit Lamp and Fundus Exam     Slit Lamp Exam       Right Left   Lids/Lashes Dermatochalasis - upper lid, mild Meibomian gland dysfunction Dermatochalasis - upper lid   Conjunctiva/Sclera White and quiet Conjunctival Cyst at 0430; otherwise white and quiet   Cornea 1+ fine Punctate epithelial erosions, Arcus 1+ fine Punctate epithelial erosions   Anterior Chamber deep and clear deep and clear   Iris Round and dilated Round and dilated   Lens 2-3+ Nuclear sclerosis, 2+ Cortical cataract 2-3+ Nuclear sclerosis, 2+ Cortical cataract   Vitreous mild Vitreous syneresis, Posterior vitreous detachment mild Vitreous syneresis, Posterior vitreous detachment, Weiss ring settled inferiorly         Fundus Exam       Right Left   Disc mild tilt, temporal PPA, Pink and Sharp tilted disc, +PPA, Compact, mild Pallor, Sharp rim   C/D Ratio 0.5 0.3   Macula Flat, blunted foveal reflex, mild RPE mottling and clumping, No heme or edema Flat, Blunted foveal reflex, Epiretinal membrane greatest superiorly, Retinal pigment epithelial mottling and clumping, No heme or edema   Vessels attenuated, mild  tortuousity, AV crossing changes attenuated, Tortuous   Periphery Attached, mild cystoid degeneration and paving stones inferiorly Attached, pigmented cystoid degeneration inferiorly, No RT/RD on 360 exam           Refraction     Wearing Rx       Sphere Cylinder Axis  Add   Right -9.00 +1.50 153 +2.25   Left -8.25 +0.75 039 +2.25    Type: PAL            IMAGING AND PROCEDURES  Imaging and Procedures for @TODAY @  OCT, Retina - OU - Both Eyes       Right Eye Quality was good. Central Foveal Thickness: 254. Progression has been stable. Findings include normal foveal contour, no SRF, no IRF (Interval release of Partial PVD).   Left Eye Quality was good. Central Foveal Thickness: 253. Progression has been stable. Findings include no SRF, no IRF, epiretinal membrane, normal foveal contour, macular pucker (Mild persistent ERM superior macula).   Notes *Images captured and stored on drive  Diagnosis / Impression:  NFP, no IRF/SRF OU ERM OS -- Mild persistent ERM superior macula   Clinical management:  See below  Abbreviations: NFP - Normal foveal profile. CME - cystoid macular edema. PED - pigment epithelial detachment. IRF - intraretinal fluid. SRF - subretinal fluid. EZ - ellipsoid zone. ERM - epiretinal membrane. ORA - outer retinal atrophy. ORT - outer retinal tubulation. SRHM - subretinal hyper-reflective material            ASSESSMENT/PLAN:    ICD-10-CM   1. Epiretinal membrane (ERM) of left eye  H35.372     2. Retinal edema  H35.81 OCT, Retina - OU - Both Eyes    3. Posterior vitreous detachment of left eye  H43.812     4. Combined forms of age-related cataract of both eyes  H25.813     5. Myopia of both eyes with astigmatism  H52.13    H52.203        1,2. Epiretinal membrane, OS  - mild ERM greatest superiorly -- stable  - asymptomatic, no metamorphopsia  - no indication for surgery at this time  - monitor for now  - f/u 1 year, DFE,  OCT  3. PVD / vitreous syneresis OS  - onset of symptoms early March 2020-- symptoms stably improved  - No RT or RD on 360 scleral depressed exam  - Discussed findings and prognosis  - Reviewed s/s of RT/RD  - Strict return precautions for any such RT/RD signs/symptoms  - monitor    4. Mixed form age related cataract OU  - The symptoms of cataract, surgical options, and treatments and risks were discussed with patient.  - discussed diagnosis and progression  - under the expert management of [place]  5. High myopia w/ astigmatism  - monitor    Ophthalmic Meds Ordered this visit:  No orders of the defined types were placed in this encounter.      Return in about 1 year (around 07/28/2022) for f/u ERM OS, DFE, OCT.  There are no Patient Instructions on file for this visit.   Explained the diagnoses, plan, and follow up with the patient and they expressed understanding.  Patient expressed understanding of the importance of proper follow up care.   This document serves as a record of services personally performed by Gardiner Sleeper, MD, PhD. It was created on their behalf by Leonie Douglas, an ophthalmic technician. The creation of this record is the provider's dictation and/or activities during the visit.    Electronically signed by: Leonie Douglas COA, 07/31/21  8:54 PM   This document serves as a record of services personally performed by Gardiner Sleeper, MD, PhD. It was created on their behalf by San Jetty. Owens Shark, OA an ophthalmic technician. The  creation of this record is the provider's dictation and/or activities during the visit.    Electronically signed by: San Jetty. Marguerita Merles 09.23.2022 8:54 PM   Gardiner Sleeper, M.D., Ph.D. Diseases & Surgery of the Retina and [place] 07/28/2021  I have reviewed the above documentation for accuracy and completeness, and I agree with the above. Gardiner Sleeper, M.D., Ph.D. 07/31/21 8:54  PM  Abbreviations: M myopia (nearsighted); A astigmatism; H hyperopia (farsighted); P presbyopia; Mrx spectacle prescription;  CTL contact lenses; OD right eye; OS left eye; OU both eyes  XT exotropia; ET esotropia; PEK punctate epithelial keratitis; PEE punctate epithelial erosions; DES dry eye syndrome; MGD meibomian gland dysfunction; ATs artificial tears; PFAT's preservative free artificial tears; [place] nuclear sclerotic cataract; PSC posterior subcapsular cataract; ERM epi-retinal membrane; PVD posterior vitreous detachment; RD retinal detachment; DM diabetes mellitus; DR diabetic retinopathy; NPDR non-proliferative diabetic retinopathy; PDR proliferative diabetic retinopathy; CSME clinically significant macular edema; DME diabetic macular edema; dbh dot blot hemorrhages; CWS cotton wool spot; POAG primary open angle glaucoma; C/D cup-to-disc ratio; HVF humphrey visual field; GVF goldmann visual field; OCT optical coherence tomography; IOP intraocular pressure; BRVO Branch retinal vein occlusion; CRVO central retinal vein occlusion; CRAO central retinal artery occlusion; BRAO branch retinal artery occlusion; RT retinal tear; SB scleral buckle; PPV pars plana vitrectomy; VH Vitreous hemorrhage; PRP panretinal laser photocoagulation; IVK intravitreal kenalog; VMT vitreomacular traction; MH Macular hole;  NVD neovascularization of the disc; NVE neovascularization elsewhere; AREDS age related eye disease study; ARMD age related macular degeneration; POAG primary open angle glaucoma; EBMD epithelial/anterior basement membrane dystrophy; ACIOL anterior chamber intraocular lens; IOL intraocular lens; PCIOL posterior chamber intraocular lens; Phaco/IOL phacoemulsification with intraocular lens placement; Ramos photorefractive keratectomy; LASIK laser assisted in situ keratomileusis; HTN hypertension; DM diabetes mellitus; COPD chronic obstructive pulmonary disease

## 2021-07-28 ENCOUNTER — Other Ambulatory Visit: Payer: Self-pay

## 2021-07-28 ENCOUNTER — Encounter (INDEPENDENT_AMBULATORY_CARE_PROVIDER_SITE_OTHER): Payer: Self-pay | Admitting: Ophthalmology

## 2021-07-28 ENCOUNTER — Ambulatory Visit (INDEPENDENT_AMBULATORY_CARE_PROVIDER_SITE_OTHER): Payer: Medicare HMO | Admitting: Ophthalmology

## 2021-07-28 DIAGNOSIS — H25813 Combined forms of age-related cataract, bilateral: Secondary | ICD-10-CM

## 2021-07-28 DIAGNOSIS — H43812 Vitreous degeneration, left eye: Secondary | ICD-10-CM

## 2021-07-28 DIAGNOSIS — H35372 Puckering of macula, left eye: Secondary | ICD-10-CM | POA: Diagnosis not present

## 2021-07-28 DIAGNOSIS — H52203 Unspecified astigmatism, bilateral: Secondary | ICD-10-CM

## 2021-07-28 DIAGNOSIS — H3581 Retinal edema: Secondary | ICD-10-CM

## 2021-07-31 ENCOUNTER — Encounter (INDEPENDENT_AMBULATORY_CARE_PROVIDER_SITE_OTHER): Payer: Self-pay | Admitting: Ophthalmology

## 2021-10-02 ENCOUNTER — Inpatient Hospital Stay (HOSPITAL_COMMUNITY)
Admission: EM | Admit: 2021-10-02 | Discharge: 2021-10-05 | DRG: 872 | Disposition: A | Discharge status: Home / Self Care | Payer: Medicare HMO | Attending: Student in an Organized Health Care Education/Training Program | Admitting: Student in an Organized Health Care Education/Training Program

## 2021-10-02 ENCOUNTER — Emergency Department (HOSPITAL_COMMUNITY): Payer: Medicare HMO

## 2021-10-02 DIAGNOSIS — Z20822 Contact with and (suspected) exposure to covid-19: Secondary | ICD-10-CM | POA: Diagnosis present

## 2021-10-02 DIAGNOSIS — A4151 Sepsis due to Escherichia coli [E. coli]: Principal | ICD-10-CM | POA: Diagnosis present

## 2021-10-02 DIAGNOSIS — E871 Hypo-osmolality and hyponatremia: Secondary | ICD-10-CM | POA: Diagnosis present

## 2021-10-02 DIAGNOSIS — E559 Vitamin D deficiency, unspecified: Secondary | ICD-10-CM | POA: Diagnosis present

## 2021-10-02 DIAGNOSIS — Z79811 Long term (current) use of aromatase inhibitors: Secondary | ICD-10-CM

## 2021-10-02 DIAGNOSIS — Z8249 Family history of ischemic heart disease and other diseases of the circulatory system: Secondary | ICD-10-CM

## 2021-10-02 DIAGNOSIS — Z79899 Other long term (current) drug therapy: Secondary | ICD-10-CM

## 2021-10-02 DIAGNOSIS — Z6841 Body Mass Index (BMI) 40.0 and over, adult: Secondary | ICD-10-CM

## 2021-10-02 DIAGNOSIS — Z800 Family history of malignant neoplasm of digestive organs: Secondary | ICD-10-CM

## 2021-10-02 DIAGNOSIS — E876 Hypokalemia: Secondary | ICD-10-CM | POA: Diagnosis not present

## 2021-10-02 DIAGNOSIS — Z806 Family history of leukemia: Secondary | ICD-10-CM

## 2021-10-02 DIAGNOSIS — E861 Hypovolemia: Secondary | ICD-10-CM | POA: Diagnosis present

## 2021-10-02 DIAGNOSIS — R630 Anorexia: Secondary | ICD-10-CM | POA: Diagnosis present

## 2021-10-02 DIAGNOSIS — Z87440 Personal history of urinary (tract) infections: Secondary | ICD-10-CM

## 2021-10-02 DIAGNOSIS — Z808 Family history of malignant neoplasm of other organs or systems: Secondary | ICD-10-CM

## 2021-10-02 DIAGNOSIS — N179 Acute kidney failure, unspecified: Secondary | ICD-10-CM | POA: Diagnosis present

## 2021-10-02 DIAGNOSIS — G35 Multiple sclerosis: Secondary | ICD-10-CM | POA: Diagnosis present

## 2021-10-02 DIAGNOSIS — Z923 Personal history of irradiation: Secondary | ICD-10-CM

## 2021-10-02 DIAGNOSIS — E7800 Pure hypercholesterolemia, unspecified: Secondary | ICD-10-CM | POA: Diagnosis present

## 2021-10-02 DIAGNOSIS — K449 Diaphragmatic hernia without obstruction or gangrene: Secondary | ICD-10-CM | POA: Diagnosis present

## 2021-10-02 DIAGNOSIS — I890 Lymphedema, not elsewhere classified: Secondary | ICD-10-CM | POA: Diagnosis present

## 2021-10-02 DIAGNOSIS — A419 Sepsis, unspecified organism: Secondary | ICD-10-CM

## 2021-10-02 DIAGNOSIS — I700 Atherosclerosis of aorta: Secondary | ICD-10-CM | POA: Diagnosis present

## 2021-10-02 DIAGNOSIS — N390 Urinary tract infection, site not specified: Secondary | ICD-10-CM

## 2021-10-02 DIAGNOSIS — Z83438 Family history of other disorder of lipoprotein metabolism and other lipidemia: Secondary | ICD-10-CM

## 2021-10-02 DIAGNOSIS — Z9221 Personal history of antineoplastic chemotherapy: Secondary | ICD-10-CM

## 2021-10-02 DIAGNOSIS — Z853 Personal history of malignant neoplasm of breast: Secondary | ICD-10-CM

## 2021-10-02 DIAGNOSIS — Z9013 Acquired absence of bilateral breasts and nipples: Secondary | ICD-10-CM

## 2021-10-02 LAB — RENAL FUNCTION PANEL
Albumin: 2.4 g/dL — ABNORMAL LOW (ref 3.5–5.0)
Anion gap: 10 (ref 5–15)
BUN: 31 mg/dL — ABNORMAL HIGH (ref 8–23)
CO2: 26 mmol/L (ref 22–32)
Calcium: 7.8 mg/dL — ABNORMAL LOW (ref 8.9–10.3)
Chloride: 93 mmol/L — ABNORMAL LOW (ref 98–111)
Creatinine, Ser: 1.87 mg/dL — ABNORMAL HIGH (ref 0.44–1.00)
GFR, Estimated: 29 mL/min — ABNORMAL LOW (ref >60)
Glucose, Bld: 114 mg/dL — ABNORMAL HIGH (ref 70–99)
Phosphorus: 1.9 mg/dL — ABNORMAL LOW (ref 2.5–4.6)
Potassium: 3 mmol/L — ABNORMAL LOW (ref 3.5–5.1)
Sodium: 129 mmol/L — ABNORMAL LOW (ref 135–145)

## 2021-10-02 LAB — COMPREHENSIVE METABOLIC PANEL
ALT: 39 U/L (ref 0–44)
AST: 73 U/L — ABNORMAL HIGH (ref 15–41)
Albumin: 2.9 g/dL — ABNORMAL LOW (ref 3.5–5.0)
Alkaline Phosphatase: 120 U/L (ref 38–126)
Anion gap: 14 (ref 5–15)
BUN: 35 mg/dL — ABNORMAL HIGH (ref 8–23)
CO2: 23 mmol/L (ref 22–32)
Calcium: 8.1 mg/dL — ABNORMAL LOW (ref 8.9–10.3)
Chloride: 92 mmol/L — ABNORMAL LOW (ref 98–111)
Creatinine, Ser: 2.44 mg/dL — ABNORMAL HIGH (ref 0.44–1.00)
GFR, Estimated: 21 mL/min — ABNORMAL LOW (ref >60)
Glucose, Bld: 133 mg/dL — ABNORMAL HIGH (ref 70–99)
Potassium: 2.4 mmol/L — CL (ref 3.5–5.1)
Sodium: 129 mmol/L — ABNORMAL LOW (ref 135–145)
Total Bilirubin: 2.7 mg/dL — ABNORMAL HIGH (ref 0.3–1.2)
Total Protein: 6.4 g/dL — ABNORMAL LOW (ref 6.5–8.1)

## 2021-10-02 LAB — CBC WITH DIFFERENTIAL/PLATELET
Abs Immature Granulocytes: 0.09 10*3/uL — ABNORMAL HIGH (ref 0.00–0.07)
Basophils Absolute: 0 10*3/uL (ref 0.0–0.1)
Basophils Relative: 0 %
Eosinophils Absolute: 0 10*3/uL (ref 0.0–0.5)
Eosinophils Relative: 0 %
HCT: 36.3 % (ref 36.0–46.0)
Hemoglobin: 13.1 g/dL (ref 12.0–15.0)
Immature Granulocytes: 1 %
Lymphocytes Relative: 3 %
Lymphs Abs: 0.5 10*3/uL — ABNORMAL LOW (ref 0.7–4.0)
MCH: 30.3 pg (ref 26.0–34.0)
MCHC: 36.1 g/dL — ABNORMAL HIGH (ref 30.0–36.0)
MCV: 83.8 fL (ref 80.0–100.0)
Monocytes Absolute: 0.6 10*3/uL (ref 0.1–1.0)
Monocytes Relative: 4 %
Neutro Abs: 13.8 10*3/uL — ABNORMAL HIGH (ref 1.7–7.7)
Neutrophils Relative %: 92 %
Platelets: 193 10*3/uL (ref 150–400)
RBC: 4.33 MIL/uL (ref 3.87–5.11)
RDW: 13.2 % (ref 11.5–15.5)
WBC: 15 10*3/uL — ABNORMAL HIGH (ref 4.0–10.5)
nRBC: 0 % (ref 0.0–0.2)

## 2021-10-02 LAB — BLOOD CULTURE ID PANEL (REFLEXED) - BCID2

## 2021-10-02 LAB — URINALYSIS, ROUTINE W REFLEX MICROSCOPIC
Bilirubin Urine: NEGATIVE
Glucose, UA: NEGATIVE mg/dL
Ketones, ur: NEGATIVE mg/dL
Nitrite: NEGATIVE
Protein, ur: 100 mg/dL — AB
Specific Gravity, Urine: 1.019 (ref 1.005–1.030)
WBC, UA: >50 WBC/hpf — ABNORMAL HIGH (ref 0–5)
pH: 5 (ref 5.0–8.0)

## 2021-10-02 LAB — LACTIC ACID, PLASMA: Lactic Acid, Venous: 1.5 mmol/L (ref 0.5–1.9)

## 2021-10-02 LAB — MAGNESIUM: Magnesium: 1.4 mg/dL — ABNORMAL LOW (ref 1.7–2.4)

## 2021-10-02 LAB — RESP PANEL BY RT-PCR (FLU A&B, COVID) ARPGX2
Influenza A by PCR: NEGATIVE
Influenza B by PCR: NEGATIVE
SARS Coronavirus 2 by RT PCR: NEGATIVE

## 2021-10-02 LAB — HIV ANTIBODY (ROUTINE TESTING W REFLEX): HIV Screen 4th Generation wRfx: NONREACTIVE

## 2021-10-02 MED ORDER — ACETAMINOPHEN 325 MG PO TABS
650.0000 mg | ORAL_TABLET | Freq: Four times a day (QID) | ORAL | Status: DC | PRN
  Administered 2021-10-02 – 2021-10-04 (×6): 650 mg via ORAL
  Filled 2021-10-02 (×6): qty 2

## 2021-10-02 MED ORDER — VITAMIN E 180 MG (400 UNIT) PO CAPS
400.0000 [IU] | ORAL_CAPSULE | Freq: Every day | ORAL | Status: DC
  Administered 2021-10-02 – 2021-10-05 (×4): 400 [IU] via ORAL
  Filled 2021-10-02 (×4): qty 1

## 2021-10-02 MED ORDER — CALCIUM CITRATE 950 (200 CA) MG PO TABS
600.0000 mg | ORAL_TABLET | Freq: Every day | ORAL | Status: DC
  Administered 2021-10-02 – 2021-10-05 (×4): 600 mg via ORAL
  Filled 2021-10-02 (×4): qty 3

## 2021-10-02 MED ORDER — MAGNESIUM SULFATE 2 GM/50ML IV SOLN
2.0000 g | Freq: Once | INTRAVENOUS | Status: AC
  Administered 2021-10-02: 09:00:00 2 g via INTRAVENOUS
  Filled 2021-10-02: qty 50

## 2021-10-02 MED ORDER — IBUPROFEN 400 MG PO TABS: 600.0000 mg | ORAL_TABLET | Freq: Once | ORAL | Status: DC

## 2021-10-02 MED ORDER — POTASSIUM CHLORIDE CRYS ER 20 MEQ PO TBCR
40.0000 meq | EXTENDED_RELEASE_TABLET | Freq: Once | ORAL | Status: AC
  Administered 2021-10-02: 09:00:00 40 meq via ORAL
  Filled 2021-10-02: qty 2

## 2021-10-02 MED ORDER — ACETAMINOPHEN 500 MG PO TABS
1000.0000 mg | ORAL_TABLET | Freq: Four times a day (QID) | ORAL | Status: DC | PRN
  Administered 2021-10-02: 1000 mg via ORAL
  Filled 2021-10-02: qty 2

## 2021-10-02 MED ORDER — SODIUM CHLORIDE 0.9 % IV SOLN
1.0000 g | Freq: Once | INTRAVENOUS | Status: AC
  Administered 2021-10-02: 09:00:00 1 g via INTRAVENOUS
  Filled 2021-10-02: qty 10

## 2021-10-02 MED ORDER — POTASSIUM CHLORIDE 20 MEQ PO PACK
40.0000 meq | PACK | Freq: Once | ORAL | Status: AC
  Administered 2021-10-02: 21:00:00 40 meq via ORAL
  Filled 2021-10-02: qty 2

## 2021-10-02 MED ORDER — POTASSIUM CHLORIDE 2 MEQ/ML IV SOLN: INTRAVENOUS | Status: DC

## 2021-10-02 MED ORDER — ENOXAPARIN SODIUM 30 MG/0.3ML IJ SOSY
30.0000 mg | PREFILLED_SYRINGE | INTRAMUSCULAR | Status: DC
  Administered 2021-10-02: 16:00:00 30 mg via SUBCUTANEOUS
  Filled 2021-10-02: qty 0.3

## 2021-10-02 MED ORDER — KCL-LACTATED RINGERS 20 MEQ/L IV SOLN
INTRAVENOUS | Status: DC
  Filled 2021-10-02 (×3): qty 1000

## 2021-10-02 MED ORDER — POTASSIUM CHLORIDE 10 MEQ/100ML IV SOLN: 10.0000 meq | INTRAVENOUS | Status: DC

## 2021-10-02 MED ORDER — POTASSIUM CHLORIDE IN NACL 20-0.9 MEQ/L-% IV SOLN
INTRAVENOUS | Status: AC
  Filled 2021-10-02: qty 1000

## 2021-10-02 MED ORDER — PRAVASTATIN SODIUM 10 MG PO TABS
10.0000 mg | ORAL_TABLET | Freq: Every day | ORAL | Status: DC
  Administered 2021-10-02 – 2021-10-04 (×3): 10 mg via ORAL
  Filled 2021-10-02 (×3): qty 1

## 2021-10-02 MED ORDER — ANASTROZOLE 1 MG PO TABS
1.0000 mg | ORAL_TABLET | Freq: Every day | ORAL | Status: DC
  Administered 2021-10-02 – 2021-10-05 (×4): 1 mg via ORAL
  Filled 2021-10-02 (×4): qty 1

## 2021-10-02 MED ORDER — ACETAMINOPHEN 650 MG RE SUPP: 650.0000 mg | Freq: Four times a day (QID) | RECTAL | Status: DC | PRN

## 2021-10-02 MED ORDER — SODIUM CHLORIDE 0.9 % IV BOLUS: 1000.0000 mL | Freq: Once | INTRAVENOUS | Status: DC

## 2021-10-02 MED ORDER — SODIUM CHLORIDE 0.9 % IV SOLN
1.0000 g | Freq: Once | INTRAVENOUS | Status: DC
  Filled 2021-10-02: qty 10

## 2021-10-02 MED ORDER — POTASSIUM CHLORIDE 2 MEQ/ML IV SOLN
INTRAVENOUS | Status: DC
  Filled 2021-10-02 (×3): qty 1000

## 2021-10-02 MED ORDER — ADULT MULTIVITAMIN W/MINERALS CH
1.0000 | ORAL_TABLET | Freq: Every day | ORAL | Status: DC
  Administered 2021-10-02 – 2021-10-04 (×3): 1 via ORAL
  Filled 2021-10-02 (×5): qty 1

## 2021-10-02 NOTE — H&P (Signed)
Date: 10/02/2021               Patient Name:  Meredith Fernandez MRN: [id number]  DOB: [birth year] Age / Sex: 70 y.o., female   PCP: Dulce Sellar, MD         Medical Service: Internal Medicine Teaching Service         Attending Physician: Dr. Aldine Contes, MD    First Contact: Dr. Ileene Musa Pager: [phone]  Second Contact: Dr. Coy Saunas Pager: [phone]       After Hours (After 5p/  First Contact Pager: [phone]  weekends / holidays): Second Contact Pager: [phone]   Chief Complaint: UTI  History of Present Illness: Ms. Meredith Fernandez is a 71 year old female with a past medical history of breast cancer, MS, NASH and lymphedema presenting with symptoms of a urinary tract infection and subjective fever.  She reports about a week ago she started having dysuria and lower abdominal pressure.  Overnight she had a subjective fever, chills and notably soaked her sheets due to significant sweating. Denies any increase in frequency, flank pain, nausea, vomiting, abdominal pain or blood in urine.  She has noted a decreased appetite over the past week.  She is also noticed her urine has been cloudy but normal color.  Denies any blood in her urine or stools.  She does recall one episode of blood when wiping after urinating but notes that this happened after a vaginal ultrasound on Friday.  She has had a urinary tract infection in the past and states that this feels similar.  Reports that she was diagnosed with breast cancer in 2015 and at that time treated with chemotherapy and radiation and is status post bilateral mastectomy.  She had a recurrence in June of this year and had a surgical resection.     Meds:  Current Meds  Medication Sig   acetaminophen (TYLENOL) 650 MG CR tablet Take 1,300 mg by mouth every 8 (eight) hours as needed for pain (Hand pain).   anastrozole (ARIMIDEX) 1 MG tablet Take 1 mg by mouth daily.   CALCIUM CITRATE PO Take 600 mg of elemental calcium by mouth daily.    hydrochlorothiazide (HYDRODIURIL) 50 MG tablet Take 50 mg by mouth daily.   Multiple Vitamins-Minerals (MULTIVITAMIN WITH MINERALS) tablet Take 1 tablet by mouth daily. Woman's plus   pravastatin (PRAVACHOL) 10 MG tablet Take 10 mg by mouth daily.   Vitamin D, Ergocalciferol, (DRISDOL) 1.25 MG (50000 UNIT) CAPS capsule Take 50,000 Units by mouth once a week. Once weekly on Thursdays   VITAMIN E PO Take 800 Units by mouth daily.     Allergies: Allergies as of 10/02/2021   (No Known Allergies)   Past Medical History:  Diagnosis Date   Acid reflux    Cancer ([facility])    Cataract    OD   History of hiatal hernia 2015   Hypercholesteremia    Multiple sclerosis ([facility]) 2001   Neuromuscular disorder ([facility]) 2001   Multiple Sclerosis   Pneumonia    Vitamin D deficiency     Family History: Brother died of colon cancer in his 63s, another brother died of prostate cancer in his 72s or 57s. Sister has leukemia and another sister has skin cancer.   Social History: Lives at home with her sister.  Able to complete all her ADLs independently.  Worked as an Chief Financial Officer but retired in 2018.  Denies any tobacco, alcohol or illicit drug use.  Review of  Systems: A complete ROS was negative except as per HPI.   Physical Exam: Blood pressure (!) 115/54, pulse 76, temperature 98.1 F (36.7 C), temperature source Oral, resp. rate 19, SpO2 100 %. Physical Exam General: alert, appears stated age, in no acute distress HEENT: Normocephalic, atraumatic, EOM intact, conjunctiva normal CV: Regular rate and rhythm, no murmurs rubs or gallops Pulm: Clear to auscultation bilaterally, normal work of breathing Abdomen: Soft, nondistended, bowel sounds present, no tenderness to palpation, no flank pain MSK: No lower extremity edema Skin: Warm and dry, well-healed surgical scar of the right chest wall Neuro: Alert and oriented x3   EKG: personally reviewed my interpretation is NSR  CXR: n/a  CT Abdomen  Pelvic w/o contrast IMPRESSION: 1. Similar left perinephric stranding with more prominent stranding along the distal left ureter today than on the prior study. No evidence for left urinary stone disease. Imaging features may be related to infection/inflammation or recent stone passage. 2. Stable 4.1 x 3.4 cm benign appearing cystic lesion in the left adnexal space. 3. Hepatic steatosis. 4. Tiny hiatal hernia. 5. Aortic Atherosclerosis (ICD10-I70.0).  Assessment & Plan by Problem: Principal Problem:   AKI (acute kidney injury) ([facility]) Active Problems:   UTI (urinary tract infection)   Hypokalemia  Meredith Fernandez is a 70 year old female with a past medical history of breast cancer, MS, NASH and lymphedema presenting with symptoms of a urinary tract infection and found to have an AKI.  AKI Creatinine 2.44, BUN 35.  Baseline creatinine 0.98.  Likely prerenal in the setting of decreased oral intake.  CT abdomen pelvis shows no evidence of obstruction.  There was distal left ureter with perinephric stranding consistent with urinary tract infection. -Trend BMP -Avoid nephrotoxic agents -Gentle hydration -Consider renal ultrasound  UTI UA consistent with infection.  CT abdomen pelvis showed left perinephric stranding along the distal left ureter.  No evidence of obstruction.  Lactic acid within normal limits. Given 1 dose of Rocephin in the emergency department.  Plan to transition to oral antibiotics tomorrow, follow culture and susceptibilities. -Follow-up urine culture  Lymphedema Patient reports a history of lymphedema and uses Unna boots at home for this.  She has not used it for the past several days as she was worried this may worsen her fluid status in the setting of infection.  Home medications include hydrochlorothiazide, will hold in the setting of AKI and hypokalemia. -Unna boots  Hypokalemia Hypomagnesia Hyponatremia Replete as needed. -Trend BMP -Hold  hydrochlorothiazide  History of breast cancer Initially diagnosed in 2015 status post chemoradiation and bilateral mastectomy.  With recurrence in June of this year status post resection of the right chest wall. -Continue Arimidex  Hyperlipidemia -Continue pravastatin 10 mg daily  Diet: Regular VTE prophylaxis Lovenox DNR  Dispo: Admit patient to Observation with expected length of stay less than 2 midnights.  Signed: Mike Craze, DO 10/02/2021, 1:27 PM  Pager: [phone] After 5pm on weekdays and 1pm on weekends: On Call pager: [phone]

## 2021-10-02 NOTE — ED Notes (Signed)
Pt placed on purewick 

## 2021-10-02 NOTE — ED Provider Notes (Signed)
[facility] EMERGENCY DEPARTMENT Provider Note   CSN: [id number] Arrival date & time: 10/02/21  0408     History Chief Complaint  Patient presents with   Fever   UTI    Meredith Fernandez is a 70 y.o. female.  Patient with history of breast cancer present today with UTI. She states that her symptoms began with dysuria 1 week ago, she attempted to manage by drinking cranberry juice without success. Symptoms progressed on Friday with development of diaphoresis and chills, soaking through her clothes and sheets several times. No documented fevers although she has been checking. She has history of UTIs in the past, however states it has been several years since her last. She also endorses that she has been drinking plenty of fluids. She denies any other symptoms, no abdominal pain, flank pain, nausea, vomiting, or diarrhea. Patient is taking HCTZ for lymphedema from breast cancer.  The history is provided by the patient. No language interpreter was used.  Fever Associated symptoms: dysuria   Associated symptoms: no chest pain, no chills, no confusion, no congestion, no cough, no diarrhea, no headaches, no nausea, no rhinorrhea and no vomiting       Past Medical History:  Diagnosis Date   Acid reflux    Cancer ([place])    Cataract    OD   History of hiatal hernia 2015   Hypercholesteremia    Multiple sclerosis ([place]) 2001   Neuromuscular disorder ([place]) 2001   Multiple Sclerosis   Pneumonia    Vitamin D deficiency     Patient Active Problem List   Diagnosis Date Noted   S/P Nissen fundoplication (without gastrostomy tube) procedure 12/27/2020   Gastroesophageal reflux disease    Hiatal hernia    Pre-op evaluation    Cancer of right breast ([place]) 09/07/2014   History of lymph node dissection of right axilla 09/07/2014   Cellulitis left chest 09/07/2014   H/O bilateral mastectomy 09/07/2014    Past Surgical History:  Procedure Laterality Date   ABDOMINAL  HYSTERECTOMY     BREAST SURGERY Bilateral 2015   CESAREAN SECTION     DILATION AND CURETTAGE OF UTERUS     ESOPHAGEAL MANOMETRY N/A 11/09/2020   Procedure: ESOPHAGEAL MANOMETRY (EM);  Surgeon: Mauri Pole, MD;  Location: [facility];  Service: Endoscopy;  Laterality: N/A;   INSERTION OF MESH N/A 12/27/2020   Procedure: INSERTION OF MESH;  Surgeon: Ralene Ok, MD;  Location: [facility];  Service: General;  Laterality: N/A;   MASTECTOMY Bilateral 02/2014   XI ROBOTIC ASSISTED HIATAL HERNIA REPAIR N/A 12/27/2020   Procedure: XI ROBOTIC ASSISTED HIATAL HERNIA REPAIR WITH MESH;  Surgeon: Ralene Ok, MD;  Location: [place];  Service: General;  Laterality: N/A;     OB History   No obstetric history on file.     Family History  Problem Relation Age of Onset   Hypertension Mother    Hyperlipidemia Mother    Hypertension Sister    Cancer Brother     Social History   Tobacco Use   Smoking status: Never   Smokeless tobacco: Never  Vaping Use   Vaping Use: Never used  Substance Use Topics   Alcohol use: Yes    Comment: occasional wine   Drug use: Never    Home Medications Prior to Admission medications   Medication Sig Start Date End Date Taking? Authorizing Provider  acetaminophen (TYLENOL) 650 MG CR tablet Take 1,300 mg by mouth every 8 (eight) hours  as needed for pain (Hand pain).    [provider]  CALCIUM CITRATE PO Take 600 mg of elemental calcium by mouth daily. D3    [provider]  COVID-19 mRNA vaccine, Pfizer, 30 MCG/0.3ML injection USE AS DIRECTED 11/16/20 11/16/21  Carlyle Basques, MD  famotidine (PEPCID) 10 MG tablet Take 10 mg by mouth daily.    [provider]  hydrochlorothiazide (HYDRODIURIL) 50 MG tablet Take 50 mg by mouth daily.    [provider]  Multiple Vitamins-Minerals (MULTIVITAMIN WITH MINERALS) tablet Take 1 tablet by mouth daily. Woman's plus    [provider]  pravastatin (PRAVACHOL) 10 MG  tablet Take 10 mg by mouth daily. 10/21/19   [provider]  VITAMIN E PO Take 800 Units by mouth daily.    [provider]    Allergies    Patient has no known allergies.  Review of Systems   Review of Systems  Constitutional:  Positive for fever. Negative for chills.  HENT:  Negative for congestion, rhinorrhea and sinus pain.   Respiratory:  Negative for apnea, cough, choking, chest tightness, shortness of breath, wheezing and stridor.   Cardiovascular:  Negative for chest pain, palpitations and leg swelling.  Gastrointestinal:  Negative for abdominal distention, abdominal pain, diarrhea, nausea and vomiting.  Genitourinary:  Positive for dysuria. Negative for flank pain and pelvic pain.  Musculoskeletal:  Negative for back pain.  Skin:  Negative for pallor and wound.  Neurological:  Negative for dizziness, tremors, seizures, syncope, facial asymmetry, speech difficulty, weakness, light-headedness, numbness and headaches.  Psychiatric/Behavioral:  Negative for confusion and decreased concentration.   All other systems reviewed and are negative.  Physical Exam Updated Vital Signs BP (!) 125/51   Pulse 84   Temp 98.1 F (36.7 C) (Oral)   Resp 15   SpO2 99%   Physical Exam Vitals and nursing note reviewed.  Constitutional:      General: She is not in acute distress.    Appearance: Normal appearance. She is normal weight. She is not ill-appearing, toxic-appearing or diaphoretic.  HENT:     Head: Normocephalic and atraumatic.  Eyes:     Extraocular Movements: Extraocular movements intact.     Pupils: Pupils are equal, round, and reactive to light.  Cardiovascular:     Rate and Rhythm: Normal rate and regular rhythm.     Heart sounds: Normal heart sounds.  Pulmonary:     Effort: Pulmonary effort is normal. No respiratory distress.     Breath sounds: Normal breath sounds. No stridor. No wheezing, rhonchi or rales.  Chest:     Chest wall: No tenderness.   Abdominal:     General: Abdomen is flat. Bowel sounds are normal. There is no distension.     Palpations: Abdomen is soft.     Tenderness: There is no abdominal tenderness. There is no right CVA tenderness, left CVA tenderness or guarding.  Musculoskeletal:        General: Normal range of motion.     Cervical back: Normal range of motion and neck supple.  Skin:    General: Skin is warm and dry.  Neurological:     General: No focal deficit present.     Mental Status: She is alert.  Psychiatric:        Mood and Affect: Mood normal.        Behavior: Behavior normal.    ED Results / Procedures / Treatments   Labs (all labs ordered are listed,  but only abnormal results are displayed) Labs Reviewed  CBC WITH DIFFERENTIAL/PLATELET - Abnormal; Notable for the following components:      Result Value   WBC 15.0 (*)    MCHC 36.1 (*)    Neutro Abs 13.8 (*)    Lymphs Abs 0.5 (*)    Abs Immature Granulocytes 0.09 (*)    All other components within normal limits  COMPREHENSIVE METABOLIC PANEL - Abnormal; Notable for the following components:   Sodium 129 (*)    Potassium 2.4 (*)    Chloride 92 (*)    Glucose, Bld 133 (*)    BUN 35 (*)    Creatinine, Ser 2.44 (*)    Calcium 8.1 (*)    Total Protein 6.4 (*)    Albumin 2.9 (*)    AST 73 (*)    Total Bilirubin 2.7 (*)    GFR, Estimated 21 (*)    All other components within normal limits  URINALYSIS, ROUTINE W REFLEX MICROSCOPIC - Abnormal; Notable for the following components:   Color, Urine AMBER (*)    APPearance TURBID (*)    Hgb urine dipstick SMALL (*)    Protein, ur 100 (*)    Leukocytes,Ua LARGE (*)    WBC, UA >50 (*)    Bacteria, UA MANY (*)    All other components within normal limits  MAGNESIUM - Abnormal; Notable for the following components:   Magnesium 1.4 (*)    All other components within normal limits  URINE CULTURE  RESP PANEL BY RT-PCR (FLU A&B, COVID) ARPGX2  LACTIC ACID, PLASMA  LACTIC ACID, PLASMA     EKG EKG Interpretation  Date/Time:  Monday October 02 2021 05:59:18 EST Ventricular Rate:  92 PR Interval:  178 QRS Duration: 82 QT Interval:  388 QTC Calculation: 479 R Axis:   34 Text Interpretation: Normal sinus rhythm Normal ECG Confirmed by Quintella Reichert [id number]) on 10/02/2021 6:25:02 AM  Radiology CT ABDOMEN PELVIS WO CONTRAST  Result Date: 10/02/2021 CLINICAL DATA:  Sepsis.  Fever and chills.  Dysuria. EXAM: CT ABDOMEN AND PELVIS WITHOUT CONTRAST TECHNIQUE: Multidetector CT imaging of the abdomen and pelvis was performed following the standard protocol without IV contrast. COMPARISON:  09/15/2020 FINDINGS: Lower chest: Tiny hiatal hernia. Hepatobiliary: The liver shows diffusely decreased attenuation suggesting fat deposition. No focal abnormality in the liver on this study without intravenous contrast. There is no evidence for gallstones, gallbladder wall thickening, or pericholecystic fluid. No intrahepatic or extrahepatic biliary dilation. Pancreas: No focal mass lesion. No dilatation of the main duct. No intraparenchymal cyst. No peripancreatic edema. Spleen: No splenomegaly. No focal mass lesion. Adrenals/Urinary Tract: No adrenal nodule or mass. Right kidney and ureter unremarkable. Similar left perinephric stranding with more prominent stranding along the distal left ureter today than on the prior study. No evidence for left urinary stone disease. The urinary bladder appears normal for the degree of distention. Stomach/Bowel: Stomach is moderately distended with fluid. Duodenum is normally positioned as is the ligament of Treitz. No small bowel wall thickening. No small bowel dilatation. The terminal ileum is normal. The appendix is not well visualized, but there is no edema or inflammation in the region of the cecum. No gross colonic mass. No colonic wall thickening. Diverticular changes are noted in the left colon without evidence of diverticulitis. Vascular/Lymphatic: There  is mild atherosclerotic calcification of the abdominal aorta without aneurysm. There is no gastrohepatic or hepatoduodenal ligament lymphadenopathy. No retroperitoneal or mesenteric lymphadenopathy. No pelvic sidewall lymphadenopathy. Reproductive: 4.1  x 3.4 cm benign appearing cystic lesion in the left adnexal space is stable. Other: No intraperitoneal free fluid. Musculoskeletal: No worrisome lytic or sclerotic osseous abnormality. IMPRESSION: 1. Similar left perinephric stranding with more prominent stranding along the distal left ureter today than on the prior study. No evidence for left urinary stone disease. Imaging features may be related to infection/inflammation or recent stone passage. 2. Stable 4.1 x 3.4 cm benign appearing cystic lesion in the left adnexal space. 3. Hepatic steatosis. 4. Tiny hiatal hernia. 5. Aortic Atherosclerosis (ICD10-I70.0). Electronically Signed   By: Misty Stanley M.D.   On: 10/02/2021 09:21    Procedures .Critical Care Performed by: Bud Face, PA-C Authorized by: Bud Face, PA-C   Critical care provider statement:    Critical care time (minutes):  35   Critical care start time:  10/02/2021 8:30 AM   Critical care end time:  10/02/2021 9:05 AM   Critical care time was exclusive of:  Separately billable procedures and treating other patients   Critical care was necessary to treat or prevent imminent or life-threatening deterioration of the following conditions:  Dehydration, metabolic crisis and sepsis   Critical care was time spent personally by me on the following activities:  Development of treatment plan with patient or surrogate, discussions with consultants, evaluation of patient's response to treatment, examination of patient, obtaining history from patient or surrogate, ordering and review of laboratory studies, ordering and review of radiographic studies, pulse oximetry and re-evaluation of patient's condition   I assumed direction of critical care  for this patient from another provider in my specialty: no     Care discussed with: admitting provider     Medications Ordered in ED Medications  acetaminophen (TYLENOL) tablet 1,000 mg (1,000 mg Oral Given 10/02/21 0445)  potassium chloride 10 mEq in 100 mL IVPB (has no administration in time range)  sodium chloride 0.9 % bolus 1,000 mL (has no administration in time range)  magnesium sulfate IVPB 2 g 50 mL (has no administration in time range)  cefTRIAXone (ROCEPHIN) 1 g in sodium chloride 0.9 % 100 mL IVPB (has no administration in time range)    ED Course  I have reviewed the triage vital signs and the nursing notes.  Pertinent labs & imaging results that were available during my care of the patient were reviewed by me and considered in my medical decision making (see chart for details).    MDM Rules/Calculators/A&P                         Patient presents today with UTI, subjective fevers, tachycardia, and mild hypotension. SIRS criteria met.   Potassium noted to be 2.4, magnesium 1.4 will repleat  Leukocytosis at 15, tachycardic initially at 103, soft blood pressures, will treat for sepsis with Rocephin. Additionally, patient with hyponatremia and creatinine elevation at 2.44 up from 0.98 9 months ago, patient denies decrease in fluid intake. Will CT scan to r/o obstruction.  CT reveals left perinephric stranding along left distal ureter, however no stone and no obstruction.   Will call for admission for management of urosepsis with AKI, hypokalemia and hypomagnesemia. Patient amenable with plan.  Discussed care with hospitalist who agrees to admit.   This is a shared visit with supervising physician Dr. Vanita Panda who has independently evaluated patient & provided guidance in evaluation/management/disposition, in agreement with care    Final Clinical Impression(s) / ED Diagnoses Final diagnoses:  Sepsis with acute  renal failure without septic shock, due to unspecified  organism, unspecified acute renal failure type ([place])  Hypokalemia  Hypomagnesemia  Acute kidney injury [facility])    Rx / DC Orders ED Discharge Orders     None        Nestor Lewandowsky 10/02/21 1417    Carmin Muskrat, MD 10/07/21 1749

## 2021-10-02 NOTE — ED Triage Notes (Signed)
Pt c/o fever, chills, urinary symptoms for approximately one week. Pt states that she has pain when urinating and a dark color to her urine. Pt denies abd pain, N/V/D.

## 2021-10-02 NOTE — ED Notes (Signed)
Pt reports she "feels wet" and thinks the Purewick may have shifted.  NT at bedside to assess.

## 2021-10-02 NOTE — ED Notes (Signed)
Patient transported to CT 

## 2021-10-02 NOTE — ED Provider Notes (Signed)
MSE was initiated and I personally evaluated the patient and placed orders (if any) at  4:40 AM on October 02, 2021.  Here with dysuria and frequency x 1 week, progressive symptoms of generalized fatigue, feeling ill, hot/cold chills. No N, V. No flank pain. No URI symptoms.   Today's Vitals   10/02/21 0435  BP: 132/63  Pulse: (!) 103  Resp: 18  Temp: 98.1 F (36.7 C)  TempSrc: Oral  SpO2: 97%   There is no height or weight on file to calculate BMI.  No abdominal tenderness No CVA tenderness Afebrile, VSS  The patient appears stable so that the remainder of the MSE may be completed by another provider.   Charlann Lange, PA-C 10/02/21 0442    Quintella Reichert, MD 10/02/21 [phone]

## 2021-10-03 DIAGNOSIS — Z79811 Long term (current) use of aromatase inhibitors: Secondary | ICD-10-CM | POA: Diagnosis not present

## 2021-10-03 DIAGNOSIS — N179 Acute kidney failure, unspecified: Secondary | ICD-10-CM

## 2021-10-03 DIAGNOSIS — Z853 Personal history of malignant neoplasm of breast: Secondary | ICD-10-CM | POA: Diagnosis not present

## 2021-10-03 DIAGNOSIS — E7800 Pure hypercholesterolemia, unspecified: Secondary | ICD-10-CM | POA: Diagnosis present

## 2021-10-03 DIAGNOSIS — Z808 Family history of malignant neoplasm of other organs or systems: Secondary | ICD-10-CM | POA: Diagnosis not present

## 2021-10-03 DIAGNOSIS — Z923 Personal history of irradiation: Secondary | ICD-10-CM | POA: Diagnosis not present

## 2021-10-03 DIAGNOSIS — I700 Atherosclerosis of aorta: Secondary | ICD-10-CM | POA: Diagnosis present

## 2021-10-03 DIAGNOSIS — Z806 Family history of leukemia: Secondary | ICD-10-CM | POA: Diagnosis not present

## 2021-10-03 DIAGNOSIS — Z9013 Acquired absence of bilateral breasts and nipples: Secondary | ICD-10-CM | POA: Diagnosis not present

## 2021-10-03 DIAGNOSIS — I890 Lymphedema, not elsewhere classified: Secondary | ICD-10-CM | POA: Diagnosis present

## 2021-10-03 DIAGNOSIS — Z20822 Contact with and (suspected) exposure to covid-19: Secondary | ICD-10-CM | POA: Diagnosis present

## 2021-10-03 DIAGNOSIS — E861 Hypovolemia: Secondary | ICD-10-CM | POA: Diagnosis present

## 2021-10-03 DIAGNOSIS — E876 Hypokalemia: Secondary | ICD-10-CM | POA: Diagnosis present

## 2021-10-03 DIAGNOSIS — N390 Urinary tract infection, site not specified: Secondary | ICD-10-CM | POA: Diagnosis present

## 2021-10-03 DIAGNOSIS — Z8249 Family history of ischemic heart disease and other diseases of the circulatory system: Secondary | ICD-10-CM | POA: Diagnosis not present

## 2021-10-03 DIAGNOSIS — Z6841 Body Mass Index (BMI) 40.0 and over, adult: Secondary | ICD-10-CM | POA: Diagnosis not present

## 2021-10-03 DIAGNOSIS — Z9221 Personal history of antineoplastic chemotherapy: Secondary | ICD-10-CM | POA: Diagnosis not present

## 2021-10-03 DIAGNOSIS — A4151 Sepsis due to Escherichia coli [E. coli]: Secondary | ICD-10-CM | POA: Diagnosis present

## 2021-10-03 DIAGNOSIS — G35 Multiple sclerosis: Secondary | ICD-10-CM | POA: Diagnosis present

## 2021-10-03 DIAGNOSIS — E871 Hypo-osmolality and hyponatremia: Secondary | ICD-10-CM | POA: Diagnosis present

## 2021-10-03 DIAGNOSIS — Z800 Family history of malignant neoplasm of digestive organs: Secondary | ICD-10-CM | POA: Diagnosis not present

## 2021-10-03 DIAGNOSIS — Z79899 Other long term (current) drug therapy: Secondary | ICD-10-CM | POA: Diagnosis not present

## 2021-10-03 DIAGNOSIS — E559 Vitamin D deficiency, unspecified: Secondary | ICD-10-CM | POA: Diagnosis present

## 2021-10-03 LAB — CBC WITH DIFFERENTIAL/PLATELET
Abs Immature Granulocytes: 0.14 10*3/uL — ABNORMAL HIGH (ref 0.00–0.07)
Basophils Absolute: 0 10*3/uL (ref 0.0–0.1)
Basophils Relative: 0 %
Eosinophils Absolute: 0 10*3/uL (ref 0.0–0.5)
Eosinophils Relative: 0 %
HCT: 33.2 % — ABNORMAL LOW (ref 36.0–46.0)
Hemoglobin: 11.6 g/dL — ABNORMAL LOW (ref 12.0–15.0)
Immature Granulocytes: 1 %
Lymphocytes Relative: 7 %
Lymphs Abs: 0.9 10*3/uL (ref 0.7–4.0)
MCH: 29.6 pg (ref 26.0–34.0)
MCHC: 34.9 g/dL (ref 30.0–36.0)
MCV: 84.7 fL (ref 80.0–100.0)
Monocytes Absolute: 0.9 10*3/uL (ref 0.1–1.0)
Monocytes Relative: 7 %
Neutro Abs: 10.5 10*3/uL — ABNORMAL HIGH (ref 1.7–7.7)
Neutrophils Relative %: 85 %
Platelets: 184 10*3/uL (ref 150–400)
RBC: 3.92 MIL/uL (ref 3.87–5.11)
RDW: 13.3 % (ref 11.5–15.5)
WBC: 12.4 10*3/uL — ABNORMAL HIGH (ref 4.0–10.5)
nRBC: 0 % (ref 0.0–0.2)

## 2021-10-03 LAB — BASIC METABOLIC PANEL
Anion gap: 8 (ref 5–15)
BUN: 28 mg/dL — ABNORMAL HIGH (ref 8–23)
CO2: 24 mmol/L (ref 22–32)
Calcium: 7.9 mg/dL — ABNORMAL LOW (ref 8.9–10.3)
Chloride: 97 mmol/L — ABNORMAL LOW (ref 98–111)
Creatinine, Ser: 1.69 mg/dL — ABNORMAL HIGH (ref 0.44–1.00)
GFR, Estimated: 33 mL/min — ABNORMAL LOW (ref >60)
Glucose, Bld: 144 mg/dL — ABNORMAL HIGH (ref 70–99)
Potassium: 3.4 mmol/L — ABNORMAL LOW (ref 3.5–5.1)
Sodium: 129 mmol/L — ABNORMAL LOW (ref 135–145)

## 2021-10-03 MED ORDER — POTASSIUM CHLORIDE CRYS ER 20 MEQ PO TBCR
30.0000 meq | EXTENDED_RELEASE_TABLET | Freq: Once | ORAL | Status: AC
  Administered 2021-10-03: 30 meq via ORAL

## 2021-10-03 MED ORDER — LACTATED RINGERS IV SOLN: INTRAVENOUS | Status: DC

## 2021-10-03 MED ORDER — POTASSIUM CHLORIDE CRYS ER 10 MEQ PO TBCR
EXTENDED_RELEASE_TABLET | ORAL | Status: AC
  Filled 2021-10-03: qty 3

## 2021-10-03 MED ORDER — SODIUM CHLORIDE 0.9 % IV SOLN
2.0000 g | Freq: Every day | INTRAVENOUS | Status: DC
  Administered 2021-10-03: 2 g via INTRAVENOUS
  Filled 2021-10-03: qty 20

## 2021-10-03 MED ORDER — ENOXAPARIN SODIUM 40 MG/0.4ML IJ SOSY
40.0000 mg | PREFILLED_SYRINGE | INTRAMUSCULAR | Status: DC
  Administered 2021-10-03 – 2021-10-04 (×2): 40 mg via SUBCUTANEOUS
  Filled 2021-10-03: qty 0.4

## 2021-10-03 NOTE — Plan of Care (Signed)
  Problem: Health Behavior/Discharge Planning: Goal: Ability to manage health-related needs will improve Outcome: Progressing   Problem: Urinary Elimination: Goal: Progression of disease will be identified and treated Outcome: Progressing   Problem: Education: Goal: Knowledge of General Education information will improve Description: Including pain rating scale, medication(s)/side effects and non-pharmacologic comfort measures Outcome: Progressing   Problem: Clinical Measurements: Goal: Will remain free from infection Outcome: Progressing Goal: Diagnostic test results will improve Outcome: Progressing

## 2021-10-03 NOTE — Progress Notes (Signed)
NEW ADMISSION NOTE New Admission Note:   Arrival Method: stretcher Mental Orientation: A&O X 4 Telemetry: none Assessment: Completed Skin: intact [id number]  LAC Pain: 0/10 Tubes: none  Safety Measures: Safety Fall Prevention Plan has been given, discussed and signed Admission: Completed [facility] Orientation: Patient has been orientated to the room, unit and staff.  Family: none at bedside  Orders have been reviewed and implemented. Will continue to monitor the patient. Call light has been placed within reach and bed alarm has been activated.   Royann Wildasin S Kuba Shepherd, RN

## 2021-10-03 NOTE — ED Notes (Signed)
This RN was called to the room by the pt. Pt stated I am wet all over. Pt was hooked up to an external catheter but the suction canister was completely full. This RN emptied the canister and helped clean the pt up. This RN changed the pt's gown and did a full linen change on the bed. External catheter was reapplied. This RN will continue to monitor.

## 2021-10-03 NOTE — Progress Notes (Signed)
HD#0 Subjective:  Overnight Events: NAEO   Feeling better this morning, still feeling a little bit warm today but definitely better than when she came in. Ate a little bit today but not a lot. She is still having burning when she pees.   Objective:  Vital signs in last 24 hours: Vitals:   10/02/21 2118 10/02/21 2338 10/03/21 0426 10/03/21 0904  BP:  132/61 (!) 124/55 (!) 149/64  Pulse: 95 99 (!) 101 97  Resp: 20 20 18  (!) 24  Temp:  99.6 F (37.6 C) 99.2 F (37.3 C) 98.9 F (37.2 C)  TempSrc:  Oral Oral Oral  SpO2: 97% 99% 96% 96%   Supplemental O2: Room Air SpO2: 96 %   Physical Exam:  Constitutional: elderly woman resting comfortably in bed, in no acute distress Cardiovascular: regular rate and rhythm, no m/r/g Pulmonary/Chest: normal work of breathing on room air, lungs clear to auscultation bilaterally Abdominal: soft, non-tender, non-distended, normal bowel sounds MSK: normal bulk and tone Neurological: alert & oriented x 3, answering questions appropriately Skin: warm and dry Psych: normal affect.  There were no vitals filed for this visit.   Intake/Output Summary (Last 24 hours) at 10/03/2021 1157 Last data filed at 10/03/2021 0645 Gross per 24 hour  Intake 1226.74 ml  Output 1200 ml  Net 26.74 ml   Net IO Since Admission: 26.74 mL [10/03/21 1157]  Pertinent Labs: CBC Latest Ref Rng & Units 10/03/2021 10/02/2021 12/22/2020  WBC 4.0 - 10.5 K/uL 12.4(H) 15.0(H) 6.9  Hemoglobin 12.0 - 15.0 g/dL 11.6(L) 13.1 14.6  Hematocrit 36.0 - 46.0 % 33.2(L) 36.3 41.8  Platelets 150 - 400 K/uL 184 193 287    CMP Latest Ref Rng & Units 10/03/2021 10/02/2021 10/02/2021  Glucose 70 - 99 mg/dL 144(H) 114(H) 133(H)  BUN 8 - 23 mg/dL 28(H) 31(H) 35(H)  Creatinine 0.44 - 1.00 mg/dL 1.69(H) 1.87(H) 2.44(H)  Sodium 135 - 145 mmol/L 129(L) 129(L) 129(L)  Potassium 3.5 - 5.1 mmol/L 3.4(L) 3.0(L) 2.4(LL)  Chloride 98 - 111 mmol/L 97(L) 93(L) 92(L)  CO2 22 - 32 mmol/L 24  26 23   Calcium 8.9 - 10.3 mg/dL 7.9(L) 7.8(L) 8.1(L)  Total Protein 6.5 - 8.1 g/dL - - 6.4(L)  Total Bilirubin 0.3 - 1.2 mg/dL - - 2.7(H)  Alkaline Phos 38 - 126 U/L - - 120  AST 15 - 41 U/L - - 73(H)  ALT 0 - 44 U/L - - 39    Imaging: No results found.  Assessment/Plan:   Principal Problem:   AKI (acute kidney injury) ([place]) Active Problems:   UTI (urinary tract infection)   Hypokalemia   Patient Summary: Meredith Fernandez is a 70 y.o. with a past medical history of breast cancer, MS, NASH and lymphedema presenting with symptoms of a urinary tract infection and found to have an E coli bacteremia and an AKI.  E coli bacteremia from UTI AKI Patient's Bcx and Ucx came back positive for E. Coli, increased ceftriaxone to 2 g daily. Creatinine improving after fluids and antibiotics, now 1.69.  -Trend BMP -Avoid nephrotoxic agents -Gentle hydration -Follow-up susceptibilities    Lymphedema Patient reports a history of lymphedema and uses Unna boots at home for this.  She has not used it for the past several days as she was worried this may worsen her fluid status in the setting of infection.  Home medications include hydrochlorothiazide, will hold in the setting of AKI and hypokalemia. -Unna boots   Hypokalemia; improving Hypomagnesia;  improving Hyponatremia Improving after repletion, will continue to monitor -Trend BMP -Hold hydrochlorothiazide   History of breast cancer Initially diagnosed in 2015 status post chemoradiation and bilateral mastectomy.  With recurrence in June of this year status post resection of the right chest wall. -Continue Arimidex   Hyperlipidemia -Continue pravastatin 10 mg daily   Diet: Regular VTE prophylaxis Lovenox DNR  Scarlett Presto, MD Internal Medicine Resident PGY-1 Pager [phone] Please contact the on call pager after 5 pm and on weekends at [phone].

## 2021-10-03 NOTE — ED Notes (Signed)
Breakfast order placed ?

## 2021-10-03 NOTE — Progress Notes (Addendum)
  Date: 10/03/2021  Patient name: Meredith Fernandez [id number]  Medical record number: [id number]  Date of birth: 29-Jul-1951   I have seen and evaluated Meredith Fernandez and discussed their care with the Residency Team.  In brief, patient is a 70 year old female with a past medical history of breast cancer, MS, nonalcoholic steatohepatitis and lymphedema who presented to the ED with dysuria over the last week.  Patient states that approximately 1 week prior to her admission she developed dysuria and lower abdominal pain described as pressure.  She was concerned that she had a urinary tract infection and had made an appointment to see her PCP yesterday but on Sunday night she developed subjective fevers, chills and diaphoresis and came to the ED for further evaluation.  Patient is also noted decreased appetite over the last week and states that her urine has been cloudy.  No chest pain, no hematuria, no palpitations, no shortness of breath, no headache, no nausea or vomiting, no diarrhea, no headache, no blurry vision, no focal weakness, tingling or numbness.  Today, patient states that she feels much better but still has some persistent dysuria.  PMHx, Fam Hx, and/or Soc Hx : As per resident admit note  Vitals:   10/03/21 0426 10/03/21 0904  BP: (!) 124/55 (!) 149/64  Pulse: (!) 101 97  Resp: 18 (!) 24  Temp: 99.2 F (37.3 C) 98.9 F (37.2 C)  SpO2: 96% 96%   General: Awake alert, oriented x3, NAD CVS: Regular rate and rhythm, normal heart sounds, Lungs: CTA bilaterally Abdomen: Soft, nontender, nondistended, normoactive bowel sounds Extremities: No edema noted, nontender to palpation Psych: Normal mood and affect HEENT: Normocephalic, atraumatic Skin: Warm and dry  Assessment and Plan: I have seen and evaluated the patient as outlined above. I agree with the formulated Assessment and Plan as detailed in the residents' note, with the following changes:   1.  E. coli bacteremia secondary to  UTI: -Patient presented to the ED with dysuria x1 week and fevers x1 day with leukocytosis up to 15 consistent with a UTI.  Patient had a CT abdomen/pelvis which showed left-sided perinephric stranding concerning for underlying infection.  Blood cultures grew E. coli in 4 out of 4 bottles and urine culture showed greater than 100,000 gram-negative rods likely E. coli as well.  Lactic acid was within normal limits -We will continue ceftriaxone 2 g every 24 hours for now -Patient leukocytosis is improved slightly to 12.  We will continue to monitor closely -Patient was noted to have an AKI on admission with a creatinine of 2.44 (baseline creatinine 0.98).  The etiology is likely prerenal in the setting of decreased oral intake.  Creatinine has improved to 1.69 today.  We will continue to monitor closely. -We will continue with IV fluids today given patient still has decreased oral intake and decreased appetite -Patient also noted to be hyponatremic likely hypovolemic in the setting of decreased oral intake as well as secondary to her thiazide diuretic.  We will hold hydrochlorothiazide at this time and monitor BMP daily -Would obtain PT/OT evaluation -No further work-up at this time -Patient will likely need an additional 1 to 2 days of IV antibiotics prior to transition oral antibiotics and DC home  Aldine Contes, MD 11/29/202211:39 AM

## 2021-10-03 NOTE — Progress Notes (Signed)
PHARMACY - PHYSICIAN COMMUNICATION CRITICAL VALUE ALERT - BLOOD CULTURE IDENTIFICATION (BCID)  Meredith Fernandez is an 70 y.o. female who presented to [facility] on 10/02/2021 with a chief complaint of UTI  Assessment:  Ecoli bacteremia (suspected source: UTI)  Name of physician (or Provider) Contacted: Dr. Humphrey Rolls  Current antibiotics: Rocephin 1gm IV q24h x 1  Changes to prescribed antibiotics recommended:  Change to Rocephin 2gm IV q24h  Results for orders placed or performed during the hospital encounter of 10/02/21  Blood Culture ID Panel (Reflexed) (Collected: 10/02/2021  8:08 AM)  Result Value Ref Range   Enterococcus faecalis NOT DETECTED NOT DETECTED   Enterococcus Faecium NOT DETECTED NOT DETECTED   Listeria monocytogenes NOT DETECTED NOT DETECTED   Staphylococcus species NOT DETECTED NOT DETECTED   Staphylococcus aureus (BCID) NOT DETECTED NOT DETECTED   Staphylococcus epidermidis NOT DETECTED NOT DETECTED   Staphylococcus lugdunensis NOT DETECTED NOT DETECTED   Streptococcus species NOT DETECTED NOT DETECTED   Streptococcus agalactiae NOT DETECTED NOT DETECTED   Streptococcus pneumoniae NOT DETECTED NOT DETECTED   Streptococcus pyogenes NOT DETECTED NOT DETECTED   A.calcoaceticus-baumannii NOT DETECTED NOT DETECTED   Bacteroides fragilis NOT DETECTED NOT DETECTED   Enterobacterales DETECTED (A) NOT DETECTED   Enterobacter cloacae complex NOT DETECTED NOT DETECTED   Escherichia coli DETECTED (A) NOT DETECTED   Klebsiella aerogenes NOT DETECTED NOT DETECTED   Klebsiella oxytoca NOT DETECTED NOT DETECTED   Klebsiella pneumoniae NOT DETECTED NOT DETECTED   Proteus species NOT DETECTED NOT DETECTED   Salmonella species NOT DETECTED NOT DETECTED   Serratia marcescens NOT DETECTED NOT DETECTED   Haemophilus influenzae NOT DETECTED NOT DETECTED   Neisseria meningitidis NOT DETECTED NOT DETECTED   Pseudomonas aeruginosa NOT DETECTED NOT DETECTED   Stenotrophomonas  maltophilia NOT DETECTED NOT DETECTED   Candida albicans NOT DETECTED NOT DETECTED   Candida auris NOT DETECTED NOT DETECTED   Candida glabrata NOT DETECTED NOT DETECTED   Candida krusei NOT DETECTED NOT DETECTED   Candida parapsilosis NOT DETECTED NOT DETECTED   Candida tropicalis NOT DETECTED NOT DETECTED   Cryptococcus neoformans/gattii NOT DETECTED NOT DETECTED   CTX-M ESBL NOT DETECTED NOT DETECTED   Carbapenem resistance IMP NOT DETECTED NOT DETECTED   Carbapenem resistance KPC NOT DETECTED NOT DETECTED   Carbapenem resistance NDM NOT DETECTED NOT DETECTED   Carbapenem resist OXA 48 LIKE NOT DETECTED NOT DETECTED   Carbapenem resistance VIM NOT DETECTED NOT DETECTED    Sherlon Handing, PharmD, BCPS Please see amion for complete clinical pharmacist phone list 10/03/2021  12:38 AM

## 2021-10-04 DIAGNOSIS — N179 Acute kidney failure, unspecified: Secondary | ICD-10-CM | POA: Diagnosis not present

## 2021-10-04 LAB — BASIC METABOLIC PANEL
Anion gap: 9 (ref 5–15)
BUN: 24 mg/dL — ABNORMAL HIGH (ref 8–23)
CO2: 22 mmol/L (ref 22–32)
Calcium: 8.1 mg/dL — ABNORMAL LOW (ref 8.9–10.3)
Chloride: 101 mmol/L (ref 98–111)
Creatinine, Ser: 1.51 mg/dL — ABNORMAL HIGH (ref 0.44–1.00)
GFR, Estimated: 37 mL/min — ABNORMAL LOW (ref >60)
Glucose, Bld: 124 mg/dL — ABNORMAL HIGH (ref 70–99)
Potassium: 3.8 mmol/L (ref 3.5–5.1)
Sodium: 132 mmol/L — ABNORMAL LOW (ref 135–145)

## 2021-10-04 LAB — URINE CULTURE: Culture: >=100000 — AB

## 2021-10-04 LAB — CBC
HCT: 30.5 % — ABNORMAL LOW (ref 36.0–46.0)
Hemoglobin: 10.6 g/dL — ABNORMAL LOW (ref 12.0–15.0)
MCH: 29.8 pg (ref 26.0–34.0)
MCHC: 34.8 g/dL (ref 30.0–36.0)
MCV: 85.7 fL (ref 80.0–100.0)
Platelets: 194 10*3/uL (ref 150–400)
RBC: 3.56 MIL/uL — ABNORMAL LOW (ref 3.87–5.11)
RDW: 13.6 % (ref 11.5–15.5)
WBC: 13.5 10*3/uL — ABNORMAL HIGH (ref 4.0–10.5)
nRBC: 0 % (ref 0.0–0.2)

## 2021-10-04 LAB — CULTURE, BLOOD (ROUTINE X 2)
Special Requests: ADEQUATE
Special Requests: ADEQUATE

## 2021-10-04 LAB — MAGNESIUM: Magnesium: 2.2 mg/dL (ref 1.7–2.4)

## 2021-10-04 MED ORDER — ENSURE ENLIVE PO LIQD
237.0000 mL | Freq: Three times a day (TID) | ORAL | Status: DC
  Administered 2021-10-04 – 2021-10-05 (×3): 237 mL via ORAL

## 2021-10-04 MED ORDER — CEFAZOLIN SODIUM-DEXTROSE 2-4 GM/100ML-% IV SOLN
2.0000 g | Freq: Three times a day (TID) | INTRAVENOUS | Status: DC
  Administered 2021-10-04 – 2021-10-05 (×4): 2 g via INTRAVENOUS
  Filled 2021-10-04 (×6): qty 100

## 2021-10-04 NOTE — Progress Notes (Signed)
Initial Nutrition Assessment  DOCUMENTATION CODES:  Morbid obesity  INTERVENTION:  Continue regular diet.  Add Ensure Plus High Protein po TID, each supplement provides 350 kcal and 20 grams of protein.   Continue MVI with minerals daily.  Encourage PO and supplement intake.  NUTRITION DIAGNOSIS:  Inadequate oral intake related to decreased appetite as evidenced by estimated needs.  GOAL:  Patient will meet greater than or equal to 90% of their needs  MONITOR:  PO intake, Supplement acceptance, Labs, Weight trends, I & O's  REASON FOR ASSESSMENT:  Malnutrition Screening Tool    ASSESSMENT:  70 yo female with a PMH of breast cancer, MS, NASH and lymphedema presenting with symptoms of a urinary tract infection and found to have an E coli bacteremia and an AKI.  Spoke with pt at bedside. Pt reports having a good appetite before getting sick last Thursday, in which she has been eating very little, as nothing tastes good to her. Discussed this further and she reports things just taste "off" in no particular way.  Explained that it is important to still receive nutrition, especially when we are sick. Pt understanding and willing to drink Ensure during hospital stay.  Per Epic, pt has lost ~3 lbs (1.1%) in the last 9 months, which is not necessarily significant for the time frame.  Of note, pt with mild BLE edema.  Recommend Ensure TID and continuing MVI with minerals daily.  Medications: reviewed; calcium citrate, MVI with minerals, Vitamin E 400 units, LR @ 100 ml/hr  Labs: reviewed; Na 132 (L), Glucose 124 (H), BUN 24 (H - trending down), Crt 1.51 (H - trending down)  NUTRITION - FOCUSED PHYSICAL EXAM: Flowsheet Row Most Recent Value  Orbital Region No depletion  Upper Arm Region No depletion  Thoracic and Lumbar Region No depletion  Buccal Region No depletion  Temple Region No depletion  Clavicle Bone Region No depletion  Clavicle and Acromion Bone Region No depletion   Scapular Bone Region No depletion  Dorsal Hand No depletion  Patellar Region No depletion  Anterior Thigh Region No depletion  Posterior Calf Region No depletion  Edema (RD Assessment) Mild  [mild BLE]  Hair Reviewed  Eyes Reviewed  Mouth Reviewed  [Dry lips and mouth]  Skin Reviewed  Nails Reviewed   Diet Order:   Diet Order             Diet regular Room service appropriate? Yes; Fluid consistency: Thin  Diet effective now                  EDUCATION NEEDS:  Education needs have been addressed  Skin:  Skin Assessment: Reviewed RN Assessment  Last BM:  10/03/21  Height:  Ht Readings from Last 1 Encounters:  10/03/21 5\' 3"  (1.6 m)   Weight:  Wt Readings from Last 1 Encounters:  10/03/21 118.8 kg   BMI:  Body mass index is 46.39 kg/m.  Estimated Nutritional Needs:  Kcal:  2200-2400 Protein:  130-145 grams Fluid:  >2.2 L  Derrel Nip, RD, LDN (she/her/hers) Clinical Inpatient Dietitian RD Pager/After-Hours/Weekend Pager # in [place]

## 2021-10-04 NOTE — Plan of Care (Signed)
  Problem: Education: Goal: Knowledge of General Education information will improve Description: Including pain rating scale, medication(s)/side effects and non-pharmacologic comfort measures Outcome: Progressing   Problem: Clinical Measurements: Goal: Ability to maintain clinical measurements within normal limits will improve Outcome: Progressing Goal: Diagnostic test results will improve Outcome: Progressing   Problem: Nutrition: Goal: Adequate nutrition will be maintained Outcome: Progressing   Problem: Elimination: Goal: Will not experience complications related to urinary retention Outcome: Progressing

## 2021-10-04 NOTE — Progress Notes (Signed)
HD#1 Subjective:  Overnight Events: NAEO   Patient said she thought she was feeling better yesterday but overnight she continued to feel hot and cold and did not have much of an appetite.  Objective:  Vital signs in last 24 hours: Vitals:   10/03/21 1451 10/03/21 1624 10/03/21 2139 10/04/21 0607  BP:  (!) 148/60 (!) 167/86 (!) 106/58  Pulse:  86 (!) 106 73  Resp:  20 18 20   Temp: 98.8 F (37.1 C) 98.2 F (36.8 C) 98.5 F (36.9 C) 98.2 F (36.8 C)  TempSrc: Oral Oral Oral Oral  SpO2:  96% 99% 97%  Weight:  118.8 kg    Height:  5\' 3"  (1.6 m)     Supplemental O2: Room Air SpO2: 97 %   Physical Exam:  Constitutional: elderly woman resting uncomfortably in bed, in no acute distress Cardiovascular: regular rate and rhythm, no m/r/g Pulmonary/Chest: normal work of breathing on room air, lungs clear to auscultation bilaterally Abdominal: soft, non-tender, distended, normal bowel sounds MSK: normal bulk and tone Neurological: alert & oriented x 3, answering questions appropriately Skin: warm and dry Psych: normal affect.   Filed Weights   10/03/21 1624  Weight: 118.8 kg     Intake/Output Summary (Last 24 hours) at 10/04/2021 0717 Last data filed at 10/04/2021 0500 Gross per 24 hour  Intake 2472.41 ml  Output 2850 ml  Net -377.59 ml   Net IO Since Admission: -350.85 mL [10/04/21 0717]  Pertinent Labs: CBC Latest Ref Rng & Units 10/04/2021 10/03/2021 10/02/2021  WBC 4.0 - 10.5 K/uL 13.5(H) 12.4(H) 15.0(H)  Hemoglobin 12.0 - 15.0 g/dL 10.6(L) 11.6(L) 13.1  Hematocrit 36.0 - 46.0 % 30.5(L) 33.2(L) 36.3  Platelets 150 - 400 K/uL 194 184 193    CMP Latest Ref Rng & Units 10/04/2021 10/03/2021 10/02/2021  Glucose 70 - 99 mg/dL 124(H) 144(H) 114(H)  BUN 8 - 23 mg/dL 24(H) 28(H) 31(H)  Creatinine 0.44 - 1.00 mg/dL 1.51(H) 1.69(H) 1.87(H)  Sodium 135 - 145 mmol/L 132(L) 129(L) 129(L)  Potassium 3.5 - 5.1 mmol/L 3.8 3.4(L) 3.0(L)  Chloride 98 - 111 mmol/L 101  97(L) 93(L)  CO2 22 - 32 mmol/L 22 24 26   Calcium 8.9 - 10.3 mg/dL 8.1(L) 7.9(L) 7.8(L)  Total Protein 6.5 - 8.1 g/dL - - -  Total Bilirubin 0.3 - 1.2 mg/dL - - -  Alkaline Phos 38 - 126 U/L - - -  AST 15 - 41 U/L - - -  ALT 0 - 44 U/L - - -    Imaging: No results found.  Assessment/Plan:   Principal Problem:   AKI (acute kidney injury) ([place]) Active Problems:   UTI (urinary tract infection)   Hypokalemia   Patient Summary: Meredith Fernandez is a 70 y.o. with a past medical history of breast cancer, MS, NASH and lymphedema presenting with symptoms of a urinary tract infection and found to have an E coli bacteremia and an AKI.   E coli bacteremia from UTI AKI Susceptibilities came back pan sensitive, deescalated antibiotics to cefazolin. Creatinine continues to improve after fluids and antibiotics, now 1.51. Patient still complaining of symptoms, will continue monitor closely. If patient does not respond in the next day or so may consider further imaging to look for abscess. Will continue with fluids given patient's poor appetite. -- d/c ceftriaxone, start cefazolin -Trend BMP -Avoid nephrotoxic agents -Gentle hydration 100/hr   Lymphedema Patient reports a history of lymphedema and uses Unna boots at home for this.  She has not used it for the past several days as she was worried this may worsen her fluid status in the setting of infection.  Home medications include hydrochlorothiazide, will hold in the setting of AKI and hypokalemia. -Unna boots   Hypokalemia; resolved Hypomagnesia; resolved Hyponatremia; improving Improving after repletion, will continue to monitor -Trend BMP -Hold hydrochlorothiazide   History of breast cancer Initially diagnosed in 2015 status post chemoradiation and bilateral mastectomy.  With recurrence in June of this year status post resection of the right chest wall. -Continue Arimidex   Hyperlipidemia -Continue pravastatin 10 mg daily    Diet: Regular VTE prophylaxis Lovenox DNR  Scarlett Presto, MD Internal Medicine Resident PGY-1 Pager [phone] Please contact the on call pager after 5 pm and on weekends at [phone].

## 2021-10-04 NOTE — Progress Notes (Signed)
Orthopedic Tech Progress Note Patient Details:  Meredith Fernandez [facility] 1951-07-20 [id number]  Ortho Devices Type of Ortho Device: Haematologist Ortho Device/Splint Location: BLE Ortho Device/Splint Interventions: Ordered, Application, Adjustment   Post Interventions Patient Tolerated: Well Instructions Provided: Care of [place] 10/04/2021, 5:57 PM

## 2021-10-05 DIAGNOSIS — N179 Acute kidney failure, unspecified: Secondary | ICD-10-CM | POA: Diagnosis not present

## 2021-10-05 LAB — CBC
HCT: 31.5 % — ABNORMAL LOW (ref 36.0–46.0)
Hemoglobin: 10.6 g/dL — ABNORMAL LOW (ref 12.0–15.0)
MCH: 29.4 pg (ref 26.0–34.0)
MCHC: 33.7 g/dL (ref 30.0–36.0)
MCV: 87.3 fL (ref 80.0–100.0)
Platelets: 229 10*3/uL (ref 150–400)
RBC: 3.61 MIL/uL — ABNORMAL LOW (ref 3.87–5.11)
RDW: 13.7 % (ref 11.5–15.5)
WBC: 12.5 10*3/uL — ABNORMAL HIGH (ref 4.0–10.5)
nRBC: 0 % (ref 0.0–0.2)

## 2021-10-05 LAB — BASIC METABOLIC PANEL
Anion gap: 9 (ref 5–15)
BUN: 23 mg/dL (ref 8–23)
CO2: 26 mmol/L (ref 22–32)
Calcium: 8.4 mg/dL — ABNORMAL LOW (ref 8.9–10.3)
Chloride: 100 mmol/L (ref 98–111)
Creatinine, Ser: 1.33 mg/dL — ABNORMAL HIGH (ref 0.44–1.00)
GFR, Estimated: 44 mL/min — ABNORMAL LOW (ref >60)
Glucose, Bld: 134 mg/dL — ABNORMAL HIGH (ref 70–99)
Potassium: 3.5 mmol/L (ref 3.5–5.1)
Sodium: 135 mmol/L (ref 135–145)

## 2021-10-05 MED ORDER — CEPHALEXIN 500 MG PO CAPS: 500.0000 mg | ORAL_CAPSULE | Freq: Four times a day (QID) | ORAL | 0 refills | Status: AC

## 2021-10-05 NOTE — Plan of Care (Signed)
  Problem: Health Behavior/Discharge Planning: Goal: Ability to manage health-related needs will improve Outcome: Progressing   Problem: Clinical Measurements: Goal: Complications related to the disease process or treatment will be avoided or minimized Outcome: Progressing   Problem: Activity: Goal: Activity intolerance will improve Outcome: Progressing

## 2021-10-05 NOTE — Progress Notes (Incomplete)
HD#2 Subjective:  Overnight Events: ***   ***  Objective:  Vital signs in last 24 hours: Vitals:   10/04/21 0800 10/04/21 1549 10/04/21 2104 10/05/21 0434  BP: 125/62 (!) 107/52 134/66 125/61  Pulse: 77 75 94 82  Resp: 18 16 19 18   Temp: 98.2 F (36.8 C) 97.9 F (36.6 C) 98.7 F (37.1 C) 99.3 F (37.4 C)  TempSrc: Oral Oral Oral Oral  SpO2: 100% 99% 99% 96%  Weight:      Height:       Supplemental O2: Room Air SpO2: 96 %   Physical Exam:  Constitutional: elderly woman resting uncomfortably in bed, in no acute distress *** Cardiovascular: regular rate and rhythm, no m/r/g Pulmonary/Chest: normal work of breathing on room air, lungs clear to auscultation bilaterally Abdominal: soft, non-tender, distended, normal bowel sounds MSK: normal bulk and tone Neurological: alert & oriented x 3, answering questions appropriately Skin: warm and dry Psych: normal affect.    Filed Weights   10/03/21 1624  Weight: 118.8 kg     Intake/Output Summary (Last 24 hours) at 10/05/2021 0618 Last data filed at 10/05/2021 0600 Gross per 24 hour  Intake 4136.84 ml  Output 2100 ml  Net 2036.84 ml   Net IO Since Admission: 1,685.99 mL [10/05/21 0618]  Pertinent Labs: CBC Latest Ref Rng & Units 10/05/2021 10/04/2021 10/03/2021  WBC 4.0 - 10.5 K/uL 12.5(H) 13.5(H) 12.4(H)  Hemoglobin 12.0 - 15.0 g/dL 10.6(L) 10.6(L) 11.6(L)  Hematocrit 36.0 - 46.0 % 31.5(L) 30.5(L) 33.2(L)  Platelets 150 - 400 K/uL 229 194 184    CMP Latest Ref Rng & Units 10/05/2021 10/04/2021 10/03/2021  Glucose 70 - 99 mg/dL 134(H) 124(H) 144(H)  BUN 8 - 23 mg/dL 23 24(H) 28(H)  Creatinine 0.44 - 1.00 mg/dL 1.33(H) 1.51(H) 1.69(H)  Sodium 135 - 145 mmol/L 135 132(L) 129(L)  Potassium 3.5 - 5.1 mmol/L 3.5 3.8 3.4(L)  Chloride 98 - 111 mmol/L 100 101 97(L)  CO2 22 - 32 mmol/L 26 22 24   Calcium 8.9 - 10.3 mg/dL 8.4(L) 8.1(L) 7.9(L)  Total Protein 6.5 - 8.1 g/dL - - -  Total Bilirubin 0.3 - 1.2 mg/dL - - -   Alkaline Phos 38 - 126 U/L - - -  AST 15 - 41 U/L - - -  ALT 0 - 44 U/L - - -    Imaging: No results found.  Assessment/Plan:   Principal Problem:   AKI (acute kidney injury) (Meredith Fernandez) Active Problems:   UTI (urinary tract infection)   Hypokalemia   Patient Summary: Meredith Fernandez is a 70 y.o. with a past medical history of breast cancer, MS, NASH and lymphedema presenting with symptoms of a urinary tract infection and found to have an E coli bacteremia and an AKI.   E coli bacteremia from UTI AKI Susceptibilities came back pan sensitive, deescalated antibiotics to cefazolin. Creatinine continues to improve after fluids and antibiotics, now 1.51. Patient still complaining of symptoms, will continue monitor closely. If patient does not respond in the next day or so may consider further imaging to look for abscess. Will continue with fluids given patient's poor appetite. *** -- continue cefazolin -Trend BMP -Avoid nephrotoxic agents -Gentle hydration 100/hr ***   Lymphedema Patient reports a history of lymphedema and uses Unna boots at home for this. She has not used it for the past several days as she was worried this may worsen her fluid status in the setting of infection.  Home medications include hydrochlorothiazide,  will hold in the setting of AKI and hypokalemia. -Unna boots   History of breast cancer Initially diagnosed in 2015 status post chemoradiation and bilateral mastectomy.  With recurrence in June of this year status post resection of the right chest wall. -Continue Arimidex   Hyperlipidemia -Continue pravastatin 10 mg daily  Hypokalemia; resolved Hypomagnesia; resolved Hyponatremia; resolved   Diet: Regular VTE prophylaxis Lovenox DNR  Scarlett Presto, MD Internal Medicine Resident PGY-1 Pager [phone] Please contact the on call pager after 5 pm and on weekends at [phone].

## 2021-10-05 NOTE — Discharge Summary (Signed)
Name: Meredith Fernandez MRN: [id number] DOB: 05-08-1951 70 y.o. PCP: Dulce Sellar, MD  Date of Admission: 10/02/2021  4:20 AM Date of Discharge:   10/05/21 Attending Physician: Axel Filler, *  Discharge Diagnosis: 1. E coli bacteremia from UTI 2. AKI 3. Lymphedema 4. Hypokalemia; resolved 5. Hypomagnesia; resolved 6. Hyponatremia; resolved 7. History of breast cancer 8. Hyperlipidemia 9. Hepatic steatosis 10. Aortic atherosclerosis    Discharge Medications: Allergies as of 10/05/2021   No Known Allergies      Medication List     TAKE these medications    acetaminophen 650 MG CR tablet Commonly known as: TYLENOL Take 1,300 mg by mouth every 8 (eight) hours as needed for pain (Hand pain).   anastrozole 1 MG tablet Commonly known as: ARIMIDEX Take 1 mg by mouth daily.   CALCIUM CITRATE PO Take 600 mg of elemental calcium by mouth daily.   cephALEXin 500 MG capsule Commonly known as: KEFLEX Take 1 capsule (500 mg total) by mouth 4 (four) times daily for 4 days.   hydrochlorothiazide 50 MG tablet Commonly known as: HYDRODIURIL Take 50 mg by mouth daily.   multivitamin with minerals tablet Take 1 tablet by mouth daily. Woman's plus   Pfizer-BioNTech COVID-19 Vacc 30 MCG/0.3ML injection Generic drug: COVID-19 mRNA vaccine (Pfizer) USE AS DIRECTED   pravastatin 10 MG tablet Commonly known as: PRAVACHOL Take 10 mg by mouth daily.   Vitamin D (Ergocalciferol) 1.25 MG (50000 UNIT) Caps capsule Commonly known as: DRISDOL Take 50,000 Units by mouth once a week. Once weekly on Thursdays   VITAMIN E PO Take 800 Units by mouth daily.        Disposition and follow-up:   [name] was discharged from [facility] in Stable condition.  At the hospital follow up visit please address:  1.  UTI/AKI- recheck creatinine and assess for dysuria  2.  Hypokalemia, hyponatremia- recheck BMP  3.  Labs / imaging needed at time  of follow-up: bmp  4.  Pending labs/ test needing follow-up: none  Follow-up Appointments:  11/28/2021 10:15 AM  Hematology and Oncology - [facility] Course by problem list: 1. E coli bacteremia from UTI; AKI- Patient came in with symptoms of dysuria, fever, and chills and was found to have UTI 2/2 E coli with associated bacteremia. CT showed perinephric stranding along the distal left ureter. Patient was initially started on ceftriaxone which was transitioned to cefazolin when sensitivities came back. Creatine improved from 2.44 on admission to 1.33 by day of discharge.  Nephrotoxic agents were avoided and patient was given gentle hydration while inpatient. Patient was feeling better and was able to get around and tolerate a normal diet and was deemed stable for discharge. She will finish a 7 day total course of antibiotics with oral kefflex  2.  Lymphedema Patient uses unna boots at home for this as well as HCTZ but this was held in setting of AKI. She will restart her home medications and continue with wraps at home.  3. Hypokalemia; Hypomagnesia; Hyponatremia;  Patient came in with low potassium at 2.4, low mag at 1.4, and low sodium at 129 in the setting of poor PO intake. Patient's K and Mg were repleted and she was given fluids.Once her intake normalized her electrolytes were WNL upon discharge.  4. History of breast cancer Continued home arimidex  5. Hyperlipidemia Continued home pravastatin 10 daily  6.  Aortic atherosclerosis Seen on CT  Discharge Exam:   BP (!) 143/70 (BP Location: Right Arm)   Pulse 70   Temp 97.8 F (36.6 C) (Oral)   Resp 17   Ht 5\' 3"  (1.6 m)   Wt 118.8 kg   SpO2 97%   BMI 46.39 kg/m  Discharge exam:  Gen: elderly woman resting comfortably in bed in NAD CV: RRR, no m/r/g Resp: CTAB, no wheezing Abd: distended, soft, nontender Neuro: alert oriented answering questions appropriately Skin: warm and dry Psych: normal  affect  Pertinent Labs, Studies, and Procedures:   CT ABDOMEN PELVIS WO CONTRAST  Result Date: 10/02/2021 CLINICAL DATA:  Sepsis.  Fever and chills.  Dysuria. EXAM: CT ABDOMEN AND PELVIS WITHOUT CONTRAST TECHNIQUE: Multidetector CT imaging of the abdomen and pelvis was performed following the standard protocol without IV contrast. COMPARISON:  09/15/2020 FINDINGS: Lower chest: Tiny hiatal hernia. Hepatobiliary: The liver shows diffusely decreased attenuation suggesting fat deposition. No focal abnormality in the liver on this study without intravenous contrast. There is no evidence for gallstones, gallbladder wall thickening, or pericholecystic fluid. No intrahepatic or extrahepatic biliary dilation. Pancreas: No focal mass lesion. No dilatation of the main duct. No intraparenchymal cyst. No peripancreatic edema. Spleen: No splenomegaly. No focal mass lesion. Adrenals/Urinary Tract: No adrenal nodule or mass. Right kidney and ureter unremarkable. Similar left perinephric stranding with more prominent stranding along the distal left ureter today than on the prior study. No evidence for left urinary stone disease. The urinary bladder appears normal for the degree of distention. Stomach/Bowel: Stomach is moderately distended with fluid. Duodenum is normally positioned as is the ligament of Treitz. No small bowel wall thickening. No small bowel dilatation. The terminal ileum is normal. The appendix is not well visualized, but there is no edema or inflammation in the region of the cecum. No gross colonic mass. No colonic wall thickening. Diverticular changes are noted in the left colon without evidence of diverticulitis. Vascular/Lymphatic: There is mild atherosclerotic calcification of the abdominal aorta without aneurysm. There is no gastrohepatic or hepatoduodenal ligament lymphadenopathy. No retroperitoneal or mesenteric lymphadenopathy. No pelvic sidewall lymphadenopathy. Reproductive: 4.1 x 3.4 cm benign  appearing cystic lesion in the left adnexal space is stable. Other: No intraperitoneal free fluid. Musculoskeletal: No worrisome lytic or sclerotic osseous abnormality. IMPRESSION: 1. Similar left perinephric stranding with more prominent stranding along the distal left ureter today than on the prior study. No evidence for left urinary stone disease. Imaging features may be related to infection/inflammation or recent stone passage. 2. Stable 4.1 x 3.4 cm benign appearing cystic lesion in the left adnexal space. 3. Hepatic steatosis. 4. Tiny hiatal hernia. 5. Aortic Atherosclerosis (ICD10-I70.0). Electronically Signed   By: Misty Stanley M.D.   On: 10/02/2021 09:21    CBC Latest Ref Rng & Units 10/05/2021 10/04/2021 10/03/2021  WBC 4.0 - 10.5 K/uL 12.5(H) 13.5(H) 12.4(H)  Hemoglobin 12.0 - 15.0 g/dL 10.6(L) 10.6(L) 11.6(L)  Hematocrit 36.0 - 46.0 % 31.5(L) 30.5(L) 33.2(L)  Platelets 150 - 400 K/uL 229 194 184    BMP Latest Ref Rng & Units 10/05/2021 10/04/2021 10/03/2021  Glucose 70 - 99 mg/dL 134(H) 124(H) 144(H)  BUN 8 - 23 mg/dL 23 24(H) 28(H)  Creatinine 0.44 - 1.00 mg/dL 1.33(H) 1.51(H) 1.69(H)  Sodium 135 - 145 mmol/L 135 132(L) 129(L)  Potassium 3.5 - 5.1 mmol/L 3.5 3.8 3.4(L)  Chloride 98 - 111 mmol/L 100 101 97(L)  CO2 22 - 32 mmol/L 26 22 24   Calcium 8.9 - 10.3 mg/dL 8.4(L) 8.1(L) 7.9(L)  Discharge Instructions: Meredith Fernandez   You were recently admitted to [facility] for a urinary tract infection. We treated you with fluids and antibiotics. You will continue taking antibiotics for four days once you go home.    Continue taking your home medications with the following changes   Start taking Keflex 500 mg four times a day. Continue this until all the pills are gone     You should seek further medical care if start to have more pain or burning with urination, fever, chills, or lightheadedness.   We recommend that you see your primary care doctor in about a week to make  sure that you continue to improve and they can recheck your kidneys to make sure you are doing better. We are so glad that you are feeling better.   Sincerely, Scarlett Presto, MD  Signed: Scarlett Presto, MD 10/05/2021, 11:30 AM   Pager: [phone]

## 2021-10-05 NOTE — Discharge Instructions (Signed)
Meredith Fernandez  You were recently admitted to [facility] for a urinary tract infection. We treated you with fluids and antibiotics. You will continue taking antibiotics for four days once you go home.   Continue taking your home medications with the following changes  Start taking Keflex 500 mg four times a day. Continue this until all the pills are gone   You should seek further medical care if start to have more pain or burning with urination, fever, chills, or lightheadedness.  We recommend that you see your primary care doctor in about a week to make sure that you continue to improve and they can recheck your kidneys to make sure you are doing better. We are so glad that you are feeling better.  Sincerely, Scarlett Presto, MD

## 2021-10-05 NOTE — Progress Notes (Signed)
DISCHARGE NOTE HOME [place] Huberty Gainey to be discharged Home per MD order. Discussed prescriptions and follow up appointments with the patient. Prescriptions given to patient; medication list explained in detail. Patient verbalized understanding.  Skin clean, dry and intact without evidence of skin break down, no evidence of skin tears noted. IV catheter discontinued intact. Site without signs and symptoms of complications. Dressing and pressure applied. Pt denies pain at the site currently. No complaints noted.  Patient free of lines, drains, and wounds.   An After Visit Summary (AVS) was printed and given to the patient. Patient escorted via wheelchair, and discharged home via private auto.  Vira Agar, RN

## 2022-06-09 IMAGING — RF DG UGI W/ HIGH DENSITY W/O KUB
8 of 10 series · 16 of 19 positions shown · non-contrast
Comparison: PET 01/29/2014, report only.

CLINICAL DATA: Evaluate hiatal hernia.

EXAM:
UPPER GI SERIES WITHOUT KUB
TECHNIQUE: Routine upper GI series was performed with thin/high density barium.
FLUOROSCOPY TIME:  Fluoroscopy Time:  2 minutes and 24 seconds
Radiation Exposure Index (if provided by the fluoroscopic device):
Not applicable.
Number of Acquired Spot Images: 0

[Series 1: cp_standard · 0.25mm/px · 1 of 1 slices shown (1 of 8)]
[im 1/1]
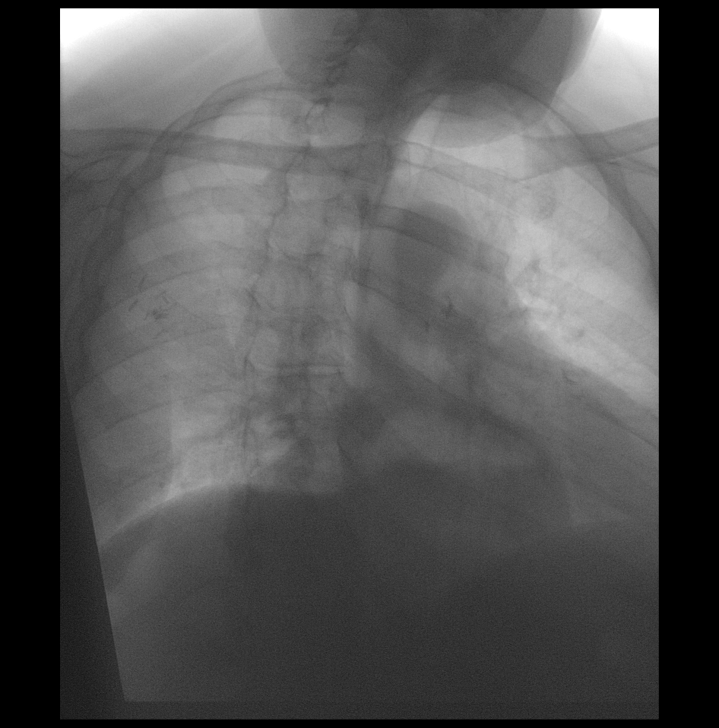

[Series 2: cp_standard · 0.51mm/px · 3 of 37 frames shown (2 of 8)]
[frame 4/37]
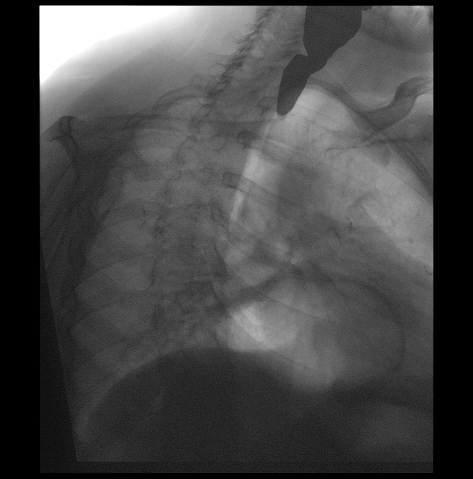
[frame 6/37]
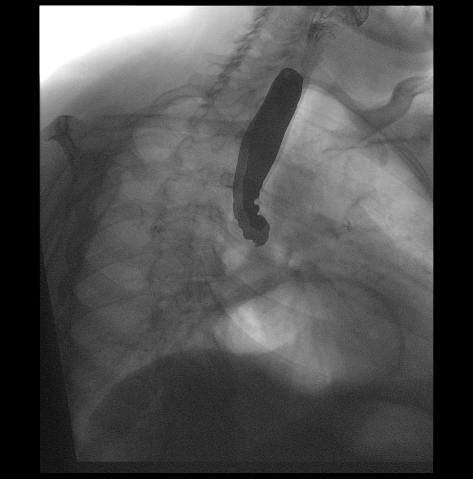
[frame 32/37]
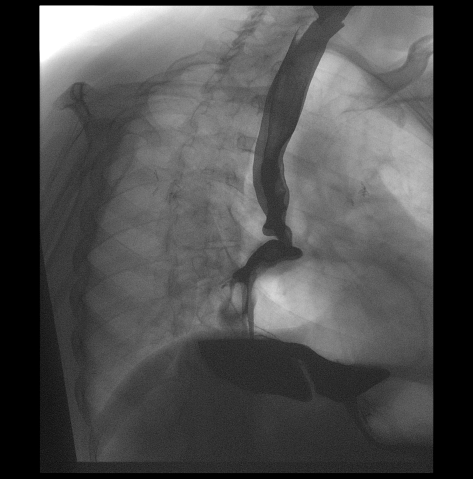

[Series 3: cp_standard · 0.51mm/px · 4 of 37 frames shown (3 of 8)]
[frame 6/37]
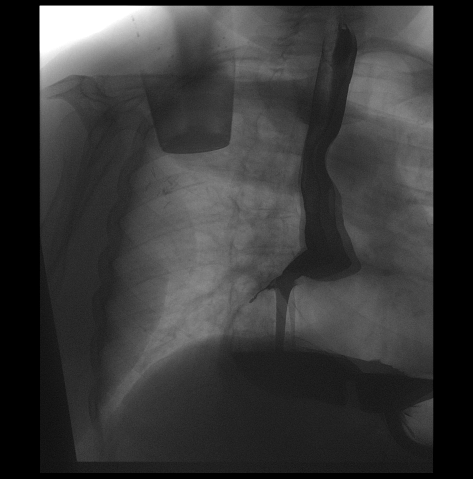
[frame 7/37]
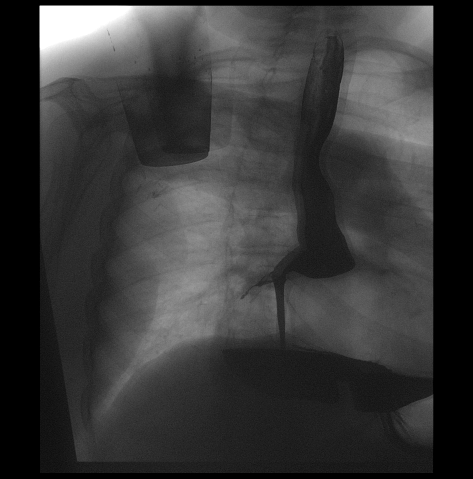
[frame 19/37]
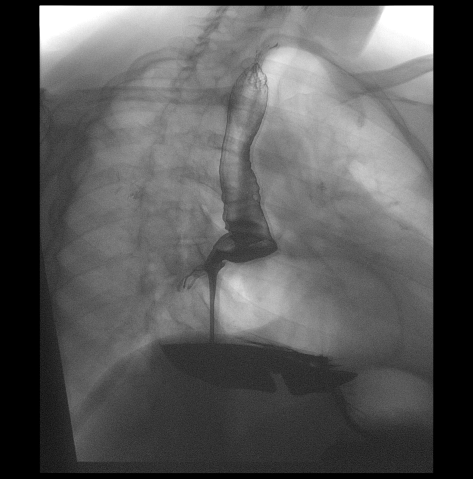
[frame 32/37]
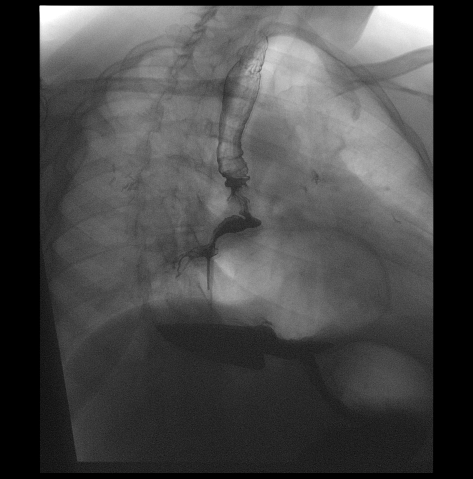

[Series 5: cp_standard · 0.25mm/px · 1 of 1 slices shown (4 of 8)]
[im 1/1]
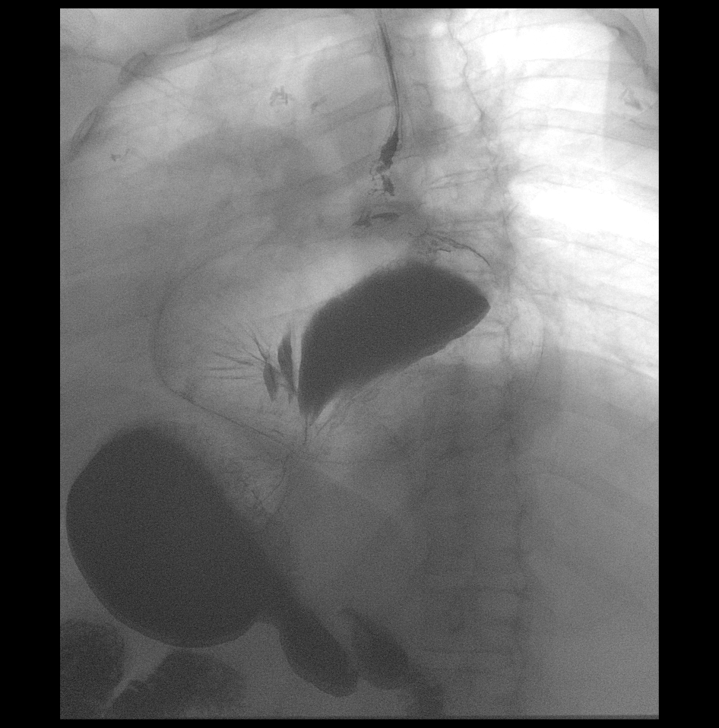

[Series 6: cp_standard · 0.51mm/px · 4 of 43 frames shown (5 of 8)]
[frame 7/43]
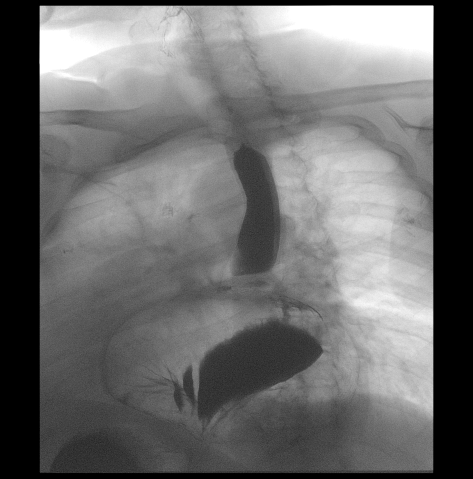
[frame 22/43]
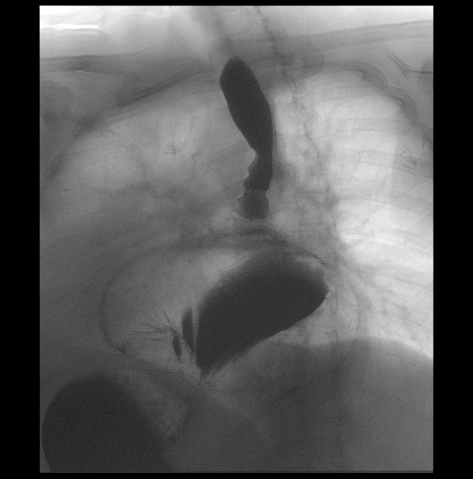
[frame 37/43]
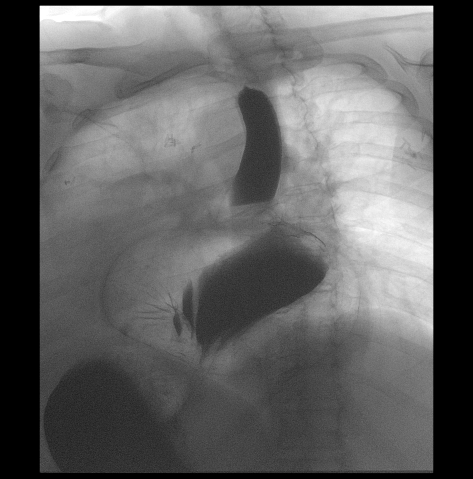
[frame 42/43]
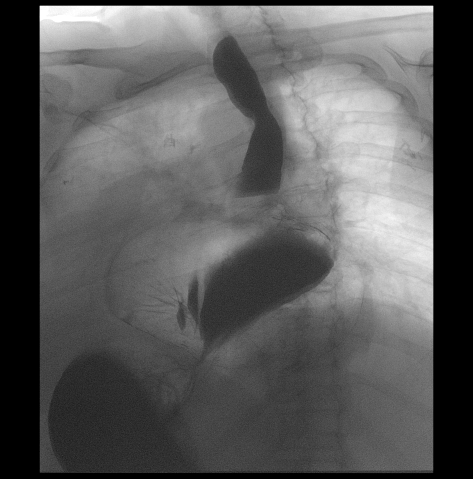

[Series 9: cp_standard · 0.25mm/px · 1 of 1 slices shown (6 of 8)]
[im 1/1]
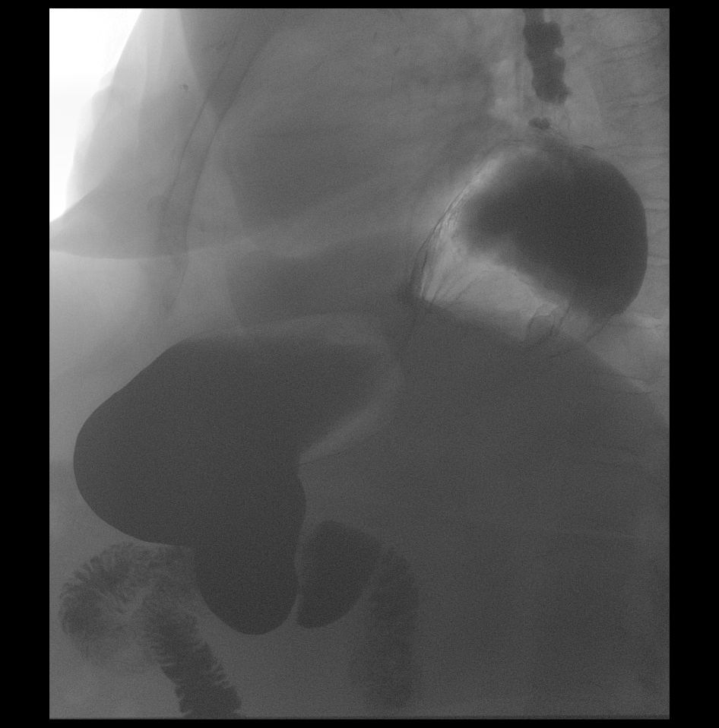

[Series 10: cp_standard · 0.25mm/px · 1 of 1 slices shown (7 of 8)]
[im 1/1]
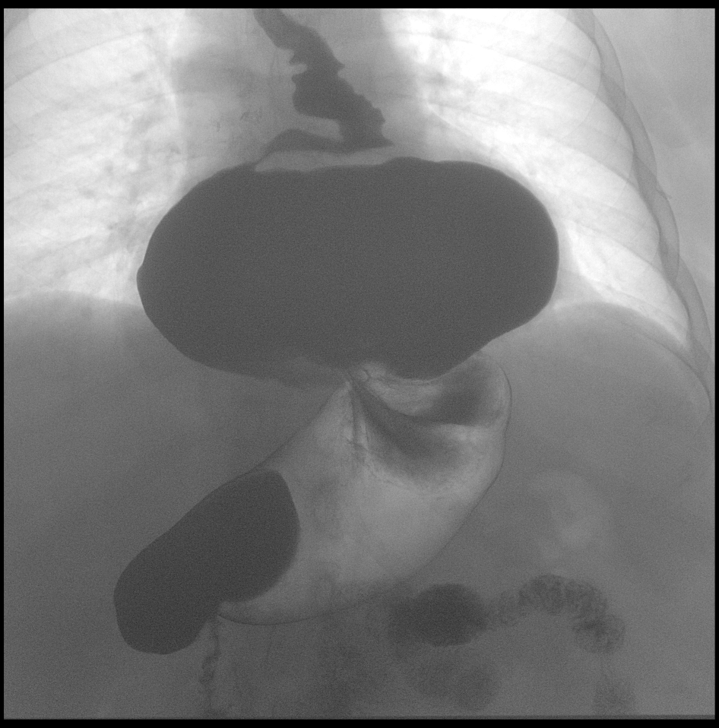

[Series 11: cp_standard · 0.25mm/px · 1 of 1 slices shown (8 of 8)]
[im 1/1]
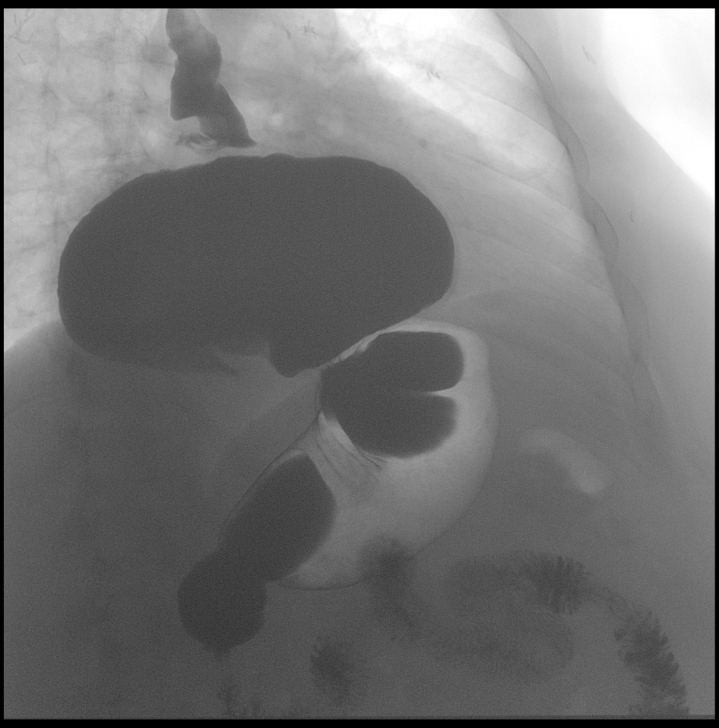

[16 of 19 positions shown; findings below may reference images not displayed]

FINDINGS: Double contrast evaluation of the esophagus demonstrates no mucosal
abnormality.

Evaluation of esophageal peristalsis demonstrates an incomplete
primary peristaltic wave with contrast stasis in the thoracic
esophagus.

Full, evaluation of the esophagus demonstrates no persistent
narrowing or stricture. A large sliding-type hiatal hernia is
identified, with greater than [DATE] of the stomach positioned within
the chest. No evidence of obstruction or volvulus.

Normal appearance of the duodenal bulb and C-loop.
IMPRESSION: 1. Large hiatal hernia.
2. Esophageal dysmotility, likely presbyesophagus.

## 2022-07-30 ENCOUNTER — Encounter (INDEPENDENT_AMBULATORY_CARE_PROVIDER_SITE_OTHER): Payer: Medicare HMO | Admitting: Ophthalmology

## 2022-08-02 NOTE — Progress Notes (Addendum)
[facility] Note  08/08/2022     CHIEF COMPLAINT Patient presents for Retina Follow Up    HISTORY OF PRESENT ILLNESS: Meredith Fernandez is a 71 y.o. female who presents to the clinic today for:   HPI     Retina Follow Up   Patient presents with  Other.  In left eye.  Severity is moderate.  Duration of 12 months.  Since onset it is stable.  I, the attending physician,  performed the HPI with the patient and updated documentation appropriately.        Comments   Pt here for 12 mo ret f/u ERM OS. Pt states VA is the same, no changes. Received new rx specs in August this year.       Last edited by Bernarda Caffey, MD on 08/10/2022 12:52 AM.    Patient states vision the same OU.  Referring physician: Clent Jacks MD [address], [place], [place] [address]  HISTORICAL INFORMATION:   Selected notes from the MEDICAL RECORD NUMBER Referred by Dr. Midge Aver for concern of VH/PVD OS LEE: 03.03.20 (C. Groat) [BCVA: OD: 20/40 OS: 20/30]  Ocular Hx-cataracts OU PMH-MS, breast cancer    CURRENT MEDICATIONS: No current outpatient medications on file. (Ophthalmic Drugs)   No current facility-administered medications for this visit. (Ophthalmic Drugs)   Current Outpatient Medications (Other)  Medication Sig   acetaminophen (TYLENOL) 650 MG CR tablet Take 1,300 mg by mouth every 8 (eight) hours as needed for pain (Hand pain).   anastrozole (ARIMIDEX) 1 MG tablet Take 1 mg by mouth daily.   furosemide (LASIX) 40 MG tablet Take 40 mg by mouth every other day.   Multiple Vitamins-Minerals (MULTIVITAMIN WITH MINERALS) tablet Take 1 tablet by mouth daily. Woman's plus   potassium chloride (KLOR-CON) 20 MEQ packet Take 20 mEq by mouth every other day.   pravastatin (PRAVACHOL) 10 MG tablet Take 10 mg by mouth daily.   Vitamin D, Ergocalciferol, (DRISDOL) 1.25 MG (50000 UNIT) CAPS capsule Take 50,000 Units by mouth once a week. Once weekly on Thursdays    VITAMIN E PO Take 800 Units by mouth daily.   CALCIUM CITRATE PO Take 600 mg of elemental calcium by mouth daily. (Patient not taking: Reported on 08/08/2022)   hydrochlorothiazide (HYDRODIURIL) 50 MG tablet Take 50 mg by mouth daily. (Patient not taking: Reported on 08/08/2022)   No current facility-administered medications for this visit. (Other)   REVIEW OF SYSTEMS: ROS   Positive for: Eyes Negative for: Constitutional, Gastrointestinal, Neurological, Skin, Genitourinary, Musculoskeletal, HENT, Endocrine, Cardiovascular, Respiratory, Psychiatric, Allergic/Imm, Heme/Lymph Last edited by Kingsley Spittle, COT on 08/08/2022  1:17 PM.     ALLERGIES No Known Allergies  PAST MEDICAL HISTORY Past Medical History:  Diagnosis Date   Acid reflux    Cancer ([place])    Cataract    OD   History of hiatal hernia 2015   Hypercholesteremia    Multiple sclerosis ([place]) 2001   Neuromuscular disorder ([place]) 2001   Multiple Sclerosis   Pneumonia    Vitamin D deficiency    Past Surgical History:  Procedure Laterality Date   ABDOMINAL HYSTERECTOMY     BREAST SURGERY Bilateral 2015   CESAREAN SECTION     DILATION AND CURETTAGE OF UTERUS     ESOPHAGEAL MANOMETRY N/A 11/09/2020   Procedure: ESOPHAGEAL MANOMETRY (EM);  Surgeon: Mauri Pole, MD;  Location: [facility];  Service: Endoscopy;  Laterality: N/A;  INSERTION OF MESH N/A 12/27/2020   Procedure: INSERTION OF MESH;  Surgeon: Ralene Ok, MD;  Location: [facility];  Service: General;  Laterality: N/A;   MASTECTOMY Bilateral 02/2014   XI ROBOTIC ASSISTED HIATAL HERNIA REPAIR N/A 12/27/2020   Procedure: XI ROBOTIC ASSISTED HIATAL HERNIA REPAIR WITH MESH;  Surgeon: Ralene Ok, MD;  Location: [facility];  Service: General;  Laterality: N/A;   FAMILY HISTORY Family History  Problem Relation Age of Onset   Hypertension Mother    Hyperlipidemia Mother    Hypertension Sister    Cancer Brother    SOCIAL HISTORY Social History   Tobacco  Use   Smoking status: Never   Smokeless tobacco: Never  Vaping Use   Vaping Use: Never used  Substance Use Topics   Alcohol use: Yes    Comment: occasional wine   Drug use: Never       OPHTHALMIC EXAM:  Base Eye Exam     Visual Acuity (Snellen - Linear)       Right Left   Dist cc 20/25 -2 20/25 +1   Dist ph cc 20/25 20/20 -1    Correction: Glasses         Tonometry (Tonopen, 1:25 PM)       Right Left   Pressure 16 14         Pupils       Dark Light Shape React APD   Right 3 2 Round Brisk None   Left 3 2 Round Brisk None         Visual Fields (Counting fingers)       Left Right    Full Full         Extraocular Movement       Right Left    Full, Ortho Full, Ortho         Neuro/Psych     Oriented x3: Yes   Mood/Affect: Normal         Dilation     Both eyes: 1.0% Mydriacyl, 2.5% Phenylephrine @ 1:26 PM           Slit Lamp and Fundus Exam     Slit Lamp Exam       Right Left   Lids/Lashes Dermatochalasis - upper lid, mild Meibomian gland dysfunction Dermatochalasis - upper lid   Conjunctiva/Sclera White and quiet Conjunctival Cyst at 0430; otherwise white and quiet   Cornea trace fine Punctate epithelial erosions, Arcus trace fine Punctate epithelial erosions   Anterior Chamber deep and clear deep and clear   Iris Round and dilated Round and dilated   Lens 2-3+ Nuclear sclerosis, 2+ Cortical cataract 2-3+ Nuclear sclerosis, 2+ Cortical cataract   Anterior Vitreous mild Vitreous syneresis, Posterior vitreous detachment mild Vitreous syneresis, Posterior vitreous detachment, Weiss ring settled inferiorly         Fundus Exam       Right Left   Disc mild tilt, temporal PPA, Pink and Sharp tilted disc, +PPA, Compact, mild Pallor, Sharp rim   C/D Ratio 0.5 0.3   Macula Flat, blunted foveal reflex, mild RPE mottling and clumping, No heme or edema Flat, Blunted foveal reflex, mild epiretinal membrane superiorly--stable, Retinal  pigment epithelial mottling and clumping, No heme or edema   Vessels attenuated, mild tortuousity, AV crossing changes attenuated, Tortuous   Periphery Attached, mild cystoid degeneration and paving stones inferiorly Attached, pigmented cystoid degeneration inferiorly, No RT/RD on 360 exam           Refraction  Wearing Rx       Sphere Cylinder Axis Add   Right -10.00 +0.75 113 +2.25   Left -9.00 +1.25 015 +2.25           IMAGING AND PROCEDURES  Imaging and Procedures for _0 @  OCT, Retina - OU - Both Eyes       Right Eye Quality was good. Central Foveal Thickness: 251. Progression has been stable. Findings include normal foveal contour, no IRF, no SRF (stable release of Partial PVD).   Left Eye Quality was good. Central Foveal Thickness: 254. Progression has been stable. Findings include normal foveal contour, no IRF, no SRF, epiretinal membrane, macular pucker (Mild persistent ERM superior macula).   Notes *Images captured and stored on drive  Diagnosis / Impression:  NFP, no IRF/SRF OU ERM OS -- Mild persistent ERM superior macula   Clinical management:  See below  Abbreviations: NFP - Normal foveal profile. CME - cystoid macular edema. PED - pigment epithelial detachment. IRF - intraretinal fluid. SRF - subretinal fluid. EZ - ellipsoid zone. ERM - epiretinal membrane. ORA - outer retinal atrophy. ORT - outer retinal tubulation. SRHM - subretinal hyper-reflective material            ASSESSMENT/PLAN:    ICD-10-CM   1. Epiretinal membrane (ERM) of left eye  H35.372 OCT, Retina - OU - Both Eyes    2. Posterior vitreous detachment of left eye  H43.812     3. Combined forms of age-related cataract of both eyes  H25.813     4. Myopia of both eyes with astigmatism  H52.13    H52.203       1. Epiretinal membrane, OS  - tr ERM greatest superiorly -- stable since 2020  - asymptomatic, no metamorphopsia  - no indication for surgery at this time  -  monitor - pt is cleared from a retina standpoint for release to Dr. Katy Fitch and resumption of primary eye care   - pt can f/u here prn  2. PVD / vitreous syneresis OS  - onset of symptoms early March 2020-- symptoms stably improved  - No RT or RD on 360 scleral depressed exam  - Discussed findings and prognosis  - Reviewed s/s of RT/RD  - Strict return precautions for any such RT/RD signs/symptoms  - monitor    3. Mixed form age related cataract OU  - The symptoms of cataract, surgical options, and treatments and risks were discussed with patient.  - discussed diagnosis and progression  - under the expert management of [place]  4. High myopia w/ astigmatism  - monitor   Ophthalmic Meds Ordered this visit:  No orders of the defined types were placed in this encounter.    Return if symptoms worsen or fail to improve.  There are no Patient Instructions on file for this visit.   Explained the diagnoses, plan, and follow up with the patient and they expressed understanding.  Patient expressed understanding of the importance of proper follow up care.   This document serves as a record of services personally performed by Gardiner Sleeper, MD, PhD. It was created on their behalf by Roselee Nova, COMT. The creation of this record is the provider's dictation and/or activities during the visit.  Electronically signed by: Roselee Nova, COMT 08/10/22 12:55 AM  Gardiner Sleeper, M.D., Ph.D. Diseases & Surgery of the Retina and Vitreous [facility]  I have reviewed the above documentation for accuracy and completeness,  and I agree with the above. Gardiner Sleeper, M.D., Ph.D. 08/10/22 12:55 AM   Abbreviations: M myopia (nearsighted); A astigmatism; H hyperopia (farsighted); P presbyopia; Mrx spectacle prescription;  CTL contact lenses; OD right eye; OS left eye; OU both eyes  XT exotropia; ET esotropia; PEK punctate epithelial keratitis; PEE punctate epithelial  erosions; DES dry eye syndrome; MGD meibomian gland dysfunction; ATs artificial tears; PFAT's preservative free artificial tears; [place] nuclear sclerotic cataract; PSC posterior subcapsular cataract; ERM epi-retinal membrane; PVD posterior vitreous detachment; RD retinal detachment; DM diabetes mellitus; DR diabetic retinopathy; NPDR non-proliferative diabetic retinopathy; PDR proliferative diabetic retinopathy; CSME clinically significant macular edema; DME diabetic macular edema; dbh dot blot hemorrhages; CWS cotton wool spot; POAG primary open angle glaucoma; C/D cup-to-disc ratio; HVF humphrey visual field; GVF goldmann visual field; OCT optical coherence tomography; IOP intraocular pressure; BRVO Branch retinal vein occlusion; CRVO central retinal vein occlusion; CRAO central retinal artery occlusion; BRAO branch retinal artery occlusion; RT retinal tear; SB scleral buckle; PPV pars plana vitrectomy; VH Vitreous hemorrhage; PRP panretinal laser photocoagulation; IVK intravitreal kenalog; VMT vitreomacular traction; MH Macular hole;  NVD neovascularization of the disc; NVE neovascularization elsewhere; AREDS age related eye disease study; ARMD age related macular degeneration; POAG primary open angle glaucoma; EBMD epithelial/anterior basement membrane dystrophy; ACIOL anterior chamber intraocular lens; IOL intraocular lens; PCIOL posterior chamber intraocular lens; Phaco/IOL phacoemulsification with intraocular lens placement; [place] photorefractive keratectomy; LASIK laser assisted in situ keratomileusis; HTN hypertension; DM diabetes mellitus; COPD chronic obstructive pulmonary disease

## 2022-08-08 ENCOUNTER — Encounter (INDEPENDENT_AMBULATORY_CARE_PROVIDER_SITE_OTHER): Payer: Self-pay | Admitting: Ophthalmology

## 2022-08-08 ENCOUNTER — Ambulatory Visit (INDEPENDENT_AMBULATORY_CARE_PROVIDER_SITE_OTHER): Payer: Medicare HMO | Admitting: Ophthalmology

## 2022-08-08 DIAGNOSIS — H25813 Combined forms of age-related cataract, bilateral: Secondary | ICD-10-CM | POA: Diagnosis not present

## 2022-08-08 DIAGNOSIS — H43812 Vitreous degeneration, left eye: Secondary | ICD-10-CM | POA: Diagnosis not present

## 2022-08-08 DIAGNOSIS — H35372 Puckering of macula, left eye: Secondary | ICD-10-CM

## 2022-08-08 DIAGNOSIS — H52203 Unspecified astigmatism, bilateral: Secondary | ICD-10-CM

## 2022-08-10 ENCOUNTER — Encounter (INDEPENDENT_AMBULATORY_CARE_PROVIDER_SITE_OTHER): Payer: Self-pay | Admitting: Ophthalmology

## 2022-09-11 ENCOUNTER — Ambulatory Visit: Payer: Medicare HMO | Admitting: Neurology

## 2022-09-11 ENCOUNTER — Telehealth: Payer: Self-pay | Admitting: Neurology

## 2022-09-11 ENCOUNTER — Encounter: Payer: Self-pay | Admitting: Neurology

## 2022-09-11 VITALS — BP 146/92 | HR 87 | Ht 62.0 in | Wt 268.5 lb

## 2022-09-11 DIAGNOSIS — Z8669 Personal history of other diseases of the nervous system and sense organs: Secondary | ICD-10-CM

## 2022-09-11 DIAGNOSIS — G35 Multiple sclerosis: Secondary | ICD-10-CM

## 2022-09-11 DIAGNOSIS — Z853 Personal history of malignant neoplasm of breast: Secondary | ICD-10-CM

## 2022-09-11 NOTE — Telephone Encounter (Signed)
[employer] medicare sent to [facility] they obtain auth [phone] 

## 2022-09-11 NOTE — Progress Notes (Signed)
[facility]  PATIENT: Meredith Fernandez DOB: November 24, 1950  REFERRING DOCTOR OR PCP:  Dr. Carney Bern SOURCE: Patient, notes from the [facility], laboratory reports  _________________________________   HISTORICAL  CHIEF COMPLAINT:  Chief Complaint  Patient presents with   New Patient (Initial Visit)    Pt in room #10 and alone. Pt here today to discuss MS.    HISTORY OF PRESENT ILLNESS:  I had the pleasure seeing your patient, Meredith Fernandez, at [facility] for neurologic consultation regarding her history of multiple sclerosis.   She is a 71 year old woman who was diagnosed with MS in 2001 after presenting with optic neuritis.    She was treated with IV steroids.      She reports having MRI s of the brain in [place] and an LP that year.     She saw Dr. Erling Cruz and was started on Avonex.    She moved to [place] in 2005.   She reports another episode of optic neuritis in 2007 and saw a neurologist in [place], [place].  She had another MRi and was told the MRI was stable.compared to her previous imaging.   She was placed on oral steroids.   Symptoms improved over a week or so.    She has had no other exacerbation.   She stopped the Avonex shortly after the 2007 ON exacerbation and has not been on any DMT since.    Marland Kitchen  Unfortunately we have no records or imaging studies.      She denies any issues with gait or balance.    She feels generally weak and te hands have a reduced grip.   She has some numbness in fingertips and toes since chemotherapy.     She has urinary frequency but no incontinence.   Vision is mildly worse.  She has seen Dr. Katy Fitch who felt vision changes were due to a prmary eye issue not MS related.      She has had swelling in her legs that started in 2017.  A little before that, her job changed to a more sedentary one.  She saw a vein specialist and had leg U/S and was told she had lymphedema.   She was placed on a lymphatic press for both  legs and placed on HCTZ.   She was placed on lasix and more recently a different diuretic.    She has had breast cancer x 2 and had surgery, RadRx and chemotherapy the first time and with anastrozole only the second time (last year)  She is on Vit D supplementation for low levels   REVIEW OF SYSTEMS: Constitutional: No fevers, chills, sweats, or change in appetite Eyes: No visual changes, double vision, eye pain Ear, nose and throat: No hearing loss, ear pain, nasal congestion, sore throat Cardiovascular: No chest pain, palpitations Respiratory:  No shortness of breath at rest or with exertion.   No wheezes GastrointestinaI: No nausea, vomiting, diarrhea, abdominal pain, fecal incontinence Genitourinary:  No dysuria, urinary retention or frequency.  No nocturia. Musculoskeletal:  No neck pain, back pain Integumentary: No rash, pruritus, skin lesions Neurological: as above Psychiatric: No depression at this time.  No anxiety Endocrine: No palpitations, diaphoresis, change in appetite, change in weigh or increased thirst Hematologic/Lymphatic:  No anemia, purpura, petechiae. Allergic/Immunologic: No itchy/runny eyes, nasal congestion, recent allergic reactions, rashes  ALLERGIES: No Known Allergies  HOME MEDICATIONS:  Current Outpatient Medications:    acetaminophen (TYLENOL) 650 MG CR tablet, Take 1,300  mg by mouth every 8 (eight) hours as needed for pain (Hand pain)., Disp: , Rfl:    anastrozole (ARIMIDEX) 1 MG tablet, Take 1 mg by mouth daily., Disp: , Rfl:    CALCIUM CITRATE PO, Take 600 mg of elemental calcium by mouth daily. (Patient not taking: Reported on 08/08/2022), Disp: , Rfl:    furosemide (LASIX) 40 MG tablet, Take 40 mg by mouth every other day., Disp: , Rfl:    hydrochlorothiazide (HYDRODIURIL) 50 MG tablet, Take 50 mg by mouth daily. (Patient not taking: Reported on 08/08/2022), Disp: , Rfl:    Multiple Vitamins-Minerals (MULTIVITAMIN WITH MINERALS) tablet, Take 1  tablet by mouth daily. Woman's plus, Disp: , Rfl:    potassium chloride (KLOR-CON) 20 MEQ packet, Take 20 mEq by mouth every other day., Disp: , Rfl:    pravastatin (PRAVACHOL) 10 MG tablet, Take 10 mg by mouth daily., Disp: , Rfl:    Vitamin D, Ergocalciferol, (DRISDOL) 1.25 MG (50000 UNIT) CAPS capsule, Take 50,000 Units by mouth once a week. Once weekly on Thursdays, Disp: , Rfl:    VITAMIN E PO, Take 800 Units by mouth daily., Disp: , Rfl:   PAST MEDICAL HISTORY: Past Medical History:  Diagnosis Date   Acid reflux    Cancer ([place])    Cataract    OD   History of hiatal hernia 2015   Hypercholesteremia    Multiple sclerosis ([place]) 2001   Neuromuscular disorder ([place]) 2001   Multiple Sclerosis   Pneumonia    Vitamin D deficiency     PAST SURGICAL HISTORY: Past Surgical History:  Procedure Laterality Date   ABDOMINAL HYSTERECTOMY     BREAST SURGERY Bilateral 2015   CESAREAN SECTION     DILATION AND CURETTAGE OF UTERUS     ESOPHAGEAL MANOMETRY N/A 11/09/2020   Procedure: ESOPHAGEAL MANOMETRY (EM);  Surgeon: Mauri Pole, MD;  Location: [facility];  Service: Endoscopy;  Laterality: N/A;   INSERTION OF MESH N/A 12/27/2020   Procedure: INSERTION OF MESH;  Surgeon: Ralene Ok, MD;  Location: [place];  Service: General;  Laterality: N/A;   MASTECTOMY Bilateral 02/2014   XI ROBOTIC ASSISTED HIATAL HERNIA REPAIR N/A 12/27/2020   Procedure: XI ROBOTIC ASSISTED HIATAL HERNIA REPAIR WITH MESH;  Surgeon: Ralene Ok, MD;  Location: [place] [facility];  Service: General;  Laterality: N/A;    FAMILY HISTORY: Family History  Problem Relation Age of Onset   Hypertension Mother    Hyperlipidemia Mother    Hypertension Sister    Cancer Brother     SOCIAL HISTORY: Social History   Socioeconomic History   Marital status: Single    Spouse name: Not on file   Number of children: Not on file   Years of education: Not on file   Highest education level: Not on file  Occupational History    Not on file  Tobacco Use   Smoking status: Never   Smokeless tobacco: Never  Vaping Use   Vaping Use: Never used  Substance and Sexual Activity   Alcohol use: Yes    Comment: occasional wine   Drug use: Never   Sexual activity: Not Currently  Other Topics Concern   Not on file  Social History Narrative   Not on file   Social Determinants of Health   Financial Resource Strain: Not on file  Food Insecurity: Not on file  Transportation Needs: Not on file  Physical Activity: Not on file  Stress: Not on file  Social Connections: Not  on file  Intimate Partner Violence: Not on file       PHYSICAL EXAM  Vitals:   09/11/22 1255  BP: (!) 146/92  Pulse: 87  Weight: 268 lb 8 oz (121.8 kg)  Height: '5\' 2"'$  (1.575 m)    Body mass index is 49.11 kg/m.   General: The patient is well-developed and well-nourished and in no acute distress  HEENT:  Head is [place]/AT.  Sclera are anicteric.  Funduscopic exam shows normal optic discs and retinal vessels.  Neck: No carotid bruits are noted.  The neck is nontender.  Cardiovascular: The heart has a regular rate and rhythm with a normal S1 and S2. There were no murmurs, gallops or rubs.    Skin/extremities: No rashes.  She has edema  Musculoskeletal:  Back is nontender  Neurologic Exam  Mental status: The patient is alert and oriented x 3 at the time of the examination. The patient has apparent normal recent and remote memory, with an apparently normal attention span and concentration ability.   Speech is normal.  Cranial nerves: Extraocular movements are full. Pupils are equal, round, and reactive to light and accomodation.  Visual fields are full.  Facial symmetry is present. There is good facial sensation to soft touch bilaterally.Facial strength is normal.  Trapezius and sternocleidomastoid strength is normal. No dysarthria is noted.  The tongue is midline, and the patient has symmetric elevation of the soft palate. No obvious  hearing deficits are noted.  Motor:  Muscle bulk is normal.   Tone is normal. Strength is  5 / 5 in all 4 extremities.   Sensory: She has reduced vibration sensation in the fingertips in the feet with normal sensation at the ankles and hand.  Mildly reduced touch sensation in the feet..  Coordination: Cerebellar testing reveals good finger-nose-finger and heel-to-shin bilaterally.  Gait and station: Station is normal.   Gait is slightly wide.  Tandem gait is mildly wide but probably normal for age Romberg is negative.   Reflexes: Deep tendon reflexes are symmetric and normal in the arms but reduced at the knees and absent in the ankles..   Plantar responses are flexor.    DIAGNOSTIC DATA (LABS, IMAGING, TESTING) - I reviewed patient records, labs, notes, testing and imaging myself where available.  Lab Results  Component Value Date   WBC 12.5 (H) 10/05/2021   HGB 10.6 (L) 10/05/2021   HCT 31.5 (L) 10/05/2021   MCV 87.3 10/05/2021   PLT 229 10/05/2021      Component Value Date/Time   NA 135 10/05/2021 0310   K 3.5 10/05/2021 0310   CL 100 10/05/2021 0310   CO2 26 10/05/2021 0310   GLUCOSE 134 (H) 10/05/2021 0310   BUN 23 10/05/2021 0310   CREATININE 1.33 (H) 10/05/2021 0310   CALCIUM 8.4 (L) 10/05/2021 0310   PROT 6.4 (L) 10/02/2021 0446   ALBUMIN 2.4 (L) 10/02/2021 1720   AST 73 (H) 10/02/2021 0446   ALT 39 10/02/2021 0446   ALKPHOS 120 10/02/2021 0446   BILITOT 2.7 (H) 10/02/2021 0446   GFRNONAA 44 (L) 10/05/2021 0310       ASSESSMENT AND PLAN  Multiple sclerosis ([place]) - Plan: MR BRAIN WO CONTRAST  History of optic neuritis  History of breast cancer    In summary, Ms. Hibner is a 71 year old woman who was diagnosed with MS in 2001 after presenting with optic neuritis.  She then had an additional exacerbation of optic neuritis in the other eye in  2007.  She has had no other exacerbation.  She was told in 2007 that the MRI was unchanged compared to 2001.   Unfortunately, we do not have any records or MRI images.  I discussed with her that she is fortunate to have a mild MS with just 2 exacerbations total.  Currently on examination besides mild visual problems and numbness in the fingers and toes (most likely from chemotherapy) she is doing well.  Many patients with milder MS are able to get off of disease modifying therapies in the 66s.  Therefore, I think it is very unlikely that she would need to ever go back on a disease modifying therapy.  I would like to check an MRI of the brain to get a better feel for the aggressiveness of her MS and to reestablish a baseline.  We will see if we can get one of the previous MRIs for comparison.  I did not schedule a follow-up visit but would be happy to see her back if she has any new or worsening neurologic symptoms and we discussed some of the symptoms that could occur without exacerbation.  Thank you for asking me to see Ms. Bacigalupi.  Please let me know if I can be of further assistance with her other patients in the future   MRI  no need for DMT  Loredana Medellin A. Felecia Shelling, MD, [facility] 76/05/3418, 3:79 PM Certified in Neurology, Clinical Neurophysiology, Sleep Medicine and Neuroimaging  [facility] [address], [place],  [address] [phone]

## 2022-09-23 ENCOUNTER — Ambulatory Visit
Admission: RE | Admit: 2022-09-23 | Discharge: 2022-09-23 | Disposition: A | Discharge status: Home / Self Care | Payer: Medicare HMO | Source: Ambulatory Visit | Attending: Neurology | Admitting: Neurology

## 2022-09-23 DIAGNOSIS — G35 Multiple sclerosis: Secondary | ICD-10-CM

## 2024-05-13 ENCOUNTER — Encounter: Payer: Self-pay | Admitting: Dermatology

## 2024-05-20 ENCOUNTER — Ambulatory Visit: Admitting: Dermatology

## 2024-05-20 ENCOUNTER — Encounter: Payer: Self-pay | Admitting: Dermatology

## 2024-05-20 VITALS — BP 125/63 | HR 93 | Temp 98.3°F

## 2024-05-20 DIAGNOSIS — D039 Melanoma in situ, unspecified: Secondary | ICD-10-CM

## 2024-05-20 DIAGNOSIS — D034 Melanoma in situ of scalp and neck: Secondary | ICD-10-CM

## 2024-05-20 DIAGNOSIS — C439 Malignant melanoma of skin, unspecified: Secondary | ICD-10-CM

## 2024-05-20 DIAGNOSIS — L814 Other melanin hyperpigmentation: Secondary | ICD-10-CM

## 2024-05-20 DIAGNOSIS — L579 Skin changes due to chronic exposure to nonionizing radiation, unspecified: Secondary | ICD-10-CM | POA: Diagnosis not present

## 2024-05-20 HISTORY — DX: Melanoma in situ, unspecified: D03.9

## 2024-05-20 NOTE — Progress Notes (Signed)
 Follow-Up Visit   Subjective  Meredith Fernandez is a 73 y.o. 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 Lesion has been previously biopsied by Dr. Glean Burrow on 04/15/2024.  Mole has been present for as long as she can remember. She noticed that it was starting to get bigger and become itchy. It may have been changing for roughly 6 months.   Patient has a BCC that was removed on the right forehead in Dr. Iver office and will rechecked in 6 weeks.    The following portions of the chart were reviewed this encounter and updated as appropriate: medications, allergies, medical history  Review of Systems:  No other skin or systemic complaints except as noted in HPI or Assessment and Plan.  Objective  Well appearing patient in no apparent distress; mood and affect are within normal limits.  A focused examination was performed of the following areas: Right inferior lateral neck Relevant physical exam findings are noted in the Assessment and Plan.   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 No   Was the anticoagulation regimen changed prior to Mohs? No    Anesthesia: Anesthesia method: local infiltration Local anesthetic: lidocaine  1% WITH epi  Procedure Details: Timeout: pre-procedure verification complete Procedure Prep: patient was prepped and draped in usual sterile fashion Prep type: chlorhexidine  Biopsy accession number: [id number] Biopsy lab: [facility] Date of biopsy: 04/15/2024 Pre-Op diagnosis: melanoma Other pre-op diagnosis: arising in a dysplastic nevus Melanoma subtype: in situ MohsAIQ Surgical site (if tumor spans multiple areas,  please select predominant area): neck Surgery side: right Surgical site (from skin exam): Right inferior lateral neck Pre-operative length (cm): 1.2 Pre-operative width (cm): 1 Indications for Mohs surgery: anatomic location where tissue conservation is critical and ill-defined borders Previously treated? No    Micrographic Surgery Details: Post-operative length (cm): 2.7 Post-operative width (cm): 2.1 Number of Mohs stages: 1 Cumulative additional sections past 5 per stage: 0 Post surgery depth of defect: dermis and subcutaneous fat  Stage 1    Tumor features identified on Mohs section: no tumor identified  Patient tolerance of procedure: tolerated well, no immediate complications  Reconstruction: Was the defect reconstructed? Yes   Was reconstruction performed by the same Mohs surgeon? Yes   Setting of reconstruction: outpatient office When was reconstruction performed? same day Type of reconstruction: linear Linear reconstruction: complex Length of linear repair (cm): 7  Opioids: Did the patient receive a prescription for opioid/narcotic related to Mohs surgery?: No    Antibiotics: Does patient meet AHA guidelines for endocarditis?: No   Does patient meet AHA guidelines for orthopedic prophylaxis?: No   Were antibiotics given on the day of surgery?: No   Did surgery breach mucosa, expose cartilage/bone, involve an area of lymphedema/inflamed/infected tissue? No    Skin repair Complexity:  Complex Final length (cm):  6.8 Informed consent: discussed and consent obtained   Timeout: patient name, date of birth, surgical site, and procedure verified   Procedure prep:  Patient was prepped and draped in usual sterile fashion Prep type:  Chlorhexidine  Anesthesia: the lesion was anesthetized in a standard fashion   Anesthetic:  1% lidocaine  w/ epinephrine  1-100,000 buffered w/ 8.4% NaHCO3 Reason for type of repair: reduce  tension to allow closure and preserve normal  anatomical and functional relationships   Subcutaneous layers (deep stitches):  Suture size:  3-0 Suture type: Monocryl (poliglecaprone 25)   Stitches:  Buried horizontal mattress Fine/surface layer approximation (top stitches):  Suture type: cyanoacrylate tissue glue   Hemostasis achieved with: electrodesiccation Outcome: patient tolerated procedure well with no complications   Post-procedure details: sterile dressing applied and wound care instructions given   Dressing type: pressure dressing (Steri Strips)      Return in about 4 weeks (around 06/17/2024) for mohs follow up.  LILLETTE Rollene Gobble, RN, am acting as scribe for RUFUS CHRISTELLA HOLY, MD .   05/20/2024  HISTORY OF PRESENT ILLNESS  Meredith Fernandez is seen in consultation at the request of Dr. Livingston for biopsy-proven Melanoma in Situ of the right inferior neck. They note that the area has been present for about 6 months increasing in size with time.  There is no history of previous treatment.  Reports no other new or changing lesions and has no other complaints today.  Medications and allergies: see patient chart.  Review of systems: Reviewed 8 systems and notable for the above skin cancer.  All other systems reviewed are unremarkable/negative, unless noted in the HPI. Past medical history, surgical history, family history, social history were also reviewed and are noted in the chart/questionnaire.    PHYSICAL EXAMINATION  General: Well-appearing, in no acute distress, alert and oriented x 4. Vitals reviewed in chart (if available).   Skin: Exam reveals a 1.2 x 1.0 cm erythematous papule and biopsy scar on the right inferior neck. There are rhytids, telangiectasias, and lentigines, consistent with photodamage.  Biopsy report(s) reviewed, confirming the diagnosis.   ASSESSMENT  1) Melanoma In Situ of the  2) photodamage 3) solar lentigines   PLAN   1. Due to location, size, histology, or recurrence and the likelihood  of subclinical extension as well as the need to conserve normal surrounding tissue, the patient was deemed acceptable for Mohs micrographic surgery (MMS).  The nature and purpose of the procedure, associated benefits and risks including recurrence and scarring, possible complications such as pain, infection, and bleeding, and alternative methods of treatment if appropriate were discussed with the patient during consent. The lesion location was verified by the patient, by reviewing previous notes, pathology reports, and by photographs as well as angulation measurements if available.  Informed consent was reviewed and signed by the patient, and timeout was performed at 8:15 AM. See op note below.  2. For the photodamage and solar lentigines, sun protection discussed/information given on OTC sunscreens, and we recommend continued regular follow-up with primary dermatologist every 6 months or sooner for any growing, bleeding, or changing lesions. 3. Prognosis and future surveillance discussed. 4. Letter with treatment outcome sent to referring provider. 5. Pain acetaminophen /ibuprofen    MOHS MICROGRAPHIC SURGERY AND RECONSTRUCTION  Initial size:   1.2 x 1.0 cm Surgical defect/wound size: 2.7 x 2.1 cm Anesthesia:    0.33% lidocaine  with 1:200,000 epinephrine  EBL:    <5 mL Complications:  None Repair type:   Complex SQ suture:   3-0 Monocryl Cutaneous suture:  Cyanoacrylate and Steristrips Final size of the repair: 6.8 cm  Stages: 1  STAGE I: Anesthesia achieved with 0.5% lidocaine  with 1:200,000 epinephrine . ChloraPrep applied. 5 section(s) excised using Mohs technique (this includes total peripheral and deep tissue margin excision and evaluation with frozen sections, excised and interpreted by the same physician). The tumor was first debulked and then  excised with an approx. 2mm margin.  Hemostasis was achieved with electrocautery as needed.  The specimen was then oriented, subdivided/relaxed,  inked, and processed using Mohs technique.  Tissue was stained with H&E and MART-1 with 2 chromogens (2 immunostains).  Frozen section analysis revealed a clear deep and peripheral margin.   Reconstruction  The surgical wound was then cleaned, prepped, and re-anesthetized as above. Wound edges were undermined extensively along at least one entire edge and at a distance equal to or greater than the width of the defect (see wound defect size above) in order to achieve closure and decrease wound tension and anatomic distortion. Redundant tissue repair including standing cone removal was performed. Hemostasis was achieved with electrocautery. Subcutaneous and epidermal tissues were approximated with the above sutures. The surgical site was then lightly scrubbed with sterile, saline-soaked gauze. Steri-strips were applied, and the area was then bandaged using Vaseline ointment, non-adherent gauze, gauze pads, and tape to provide an adequate pressure dressing. The patient tolerated the procedure well, was given detailed written and verbal wound care instructions, and was discharged in good condition.   The patient will follow-up: 4 weeks.   Documentation: I have reviewed the above documentation for accuracy and completeness, and I agree with the above.  RUFUS CHRISTELLA HOLY, MD

## 2024-05-20 NOTE — Patient Instructions (Signed)

## 2024-05-27 ENCOUNTER — Encounter: Payer: Self-pay | Admitting: Dermatology

## 2024-06-17 ENCOUNTER — Ambulatory Visit: Admitting: Dermatology

## 2024-06-17 ENCOUNTER — Encounter: Payer: Self-pay | Admitting: Dermatology

## 2024-06-17 DIAGNOSIS — Z85820 Personal history of malignant melanoma of skin: Secondary | ICD-10-CM | POA: Diagnosis not present

## 2024-06-17 DIAGNOSIS — L905 Scar conditions and fibrosis of skin: Secondary | ICD-10-CM | POA: Diagnosis not present

## 2024-06-17 DIAGNOSIS — C439 Malignant melanoma of skin, unspecified: Secondary | ICD-10-CM

## 2024-06-17 NOTE — Progress Notes (Signed)
   Follow Up Visit   Subjective  Meredith Fernandez is a 73 y.o. female who presents for the following: follow up from Mohs surgery   The patient presents for follow up from Mohs surgery for a Malignant Melanoma on the right inferior lateral neck, treated on 05/20/2024, repaired with a complex linear closure. The patient has been bandaging the wound as directed. The endorse the following concerns: none.  Patient states that the pressure bandage stayed in place for 1.5 days and the steri strips stayed in place for 6 days. She moisturizes the area at least 3 times a day with a Jergans moisturizer with Aragon oil.  The following portions of the chart were reviewed this encounter and updated as appropriate: medications, allergies, medical history  Review of Systems:  No other skin or systemic complaints except as noted in HPI or Assessment and Plan.  Objective  Well appearing patient in no apparent distress; mood and affect are within normal limits.  A focal examination was performed including the neck. All findings within normal limits unless otherwise noted below.  Healing wound with mild erythema  Relevant physical exam findings are noted in the Assessment and Plan.    Assessment & Plan   Scar s/p Mohs for a Malignant melanoma on the right inferior lateral neck, treated on 074/16/2025, repaired with a complex linear closure - Reassured that wound is healing well - No evidence of infection - No swelling, induration, purulence, dehiscence, or tenderness out of proportion to the clinical exam, see photo above - Discussed that scars take up to 12 months to mature from the date of surgery - Recommend SPF 30+ to scar daily to prevent purple color from UV exposure during scar maturation process - Discussed that erythema and raised appearance of scar will fade over the next 4-6 months - OK to start scar massage at 4-6 weeks post-op - Can consider silicone based products for scar healing  starting at 6 weeks post-op - Ok to discontinue ointment daily   HISTORY OF MELANOMA - No evidence of recurrence today - No lymphadenopathy - Recommend regular full body skin exams - Recommend daily broad spectrum sunscreen SPF 30+ to sun-exposed areas, reapply every 2 hours as needed.  - Call if any new or changing lesions are noted between office visits     Return if symptoms worsen or fail to improve.  LILLETTE Rollene Gobble, RN, am acting as scribe for RUFUS CHRISTELLA HOLY, MD .   Documentation: I have reviewed the above documentation for accuracy and completeness, and I agree with the above.  RUFUS CHRISTELLA HOLY, MD

## 2024-06-17 NOTE — Patient Instructions (Signed)
